# Patient Record
Sex: Female | Born: 1990 | Hispanic: Yes | Marital: Single
Health system: Southern US, Academic
[De-identification: ages and names within clinical notes are randomized; demographics above are authoritative.]

## PROBLEM LIST (undated history)

## (undated) ENCOUNTER — Inpatient Hospital Stay: Payer: Self-pay

## (undated) ENCOUNTER — Telehealth

## (undated) ENCOUNTER — Encounter

## (undated) ENCOUNTER — Ambulatory Visit: Payer: MEDICAID | Attending: Family | Primary: Family

## (undated) ENCOUNTER — Ambulatory Visit

## (undated) ENCOUNTER — Encounter: Attending: Family | Primary: Family

## (undated) ENCOUNTER — Ambulatory Visit: Payer: MEDICAID

## (undated) ENCOUNTER — Ambulatory Visit: Payer: Medicaid (Managed Care)

## (undated) DIAGNOSIS — R10814 Left lower quadrant abdominal tenderness: Secondary | ICD-10-CM

## (undated) DIAGNOSIS — K8020 Calculus of gallbladder without cholecystitis without obstruction: Secondary | ICD-10-CM

## (undated) DIAGNOSIS — R112 Nausea with vomiting, unspecified: Secondary | ICD-10-CM

## (undated) DIAGNOSIS — Z87442 Personal history of urinary calculi: Secondary | ICD-10-CM

## (undated) HISTORY — DX: Nausea with vomiting, unspecified: R11.2

## (undated) HISTORY — DX: Left lower quadrant abdominal tenderness: R10.814

---
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
## 2013-08-18 ENCOUNTER — Emergency Department: Payer: Self-pay | Admitting: Emergency Medicine

## 2013-08-18 LAB — COMPREHENSIVE METABOLIC PANEL
Albumin: 4.4 g/dL (ref 3.4–5.0)
Bilirubin,Total: 1 mg/dL (ref 0.2–1.0)
Calcium, Total: 9.5 mg/dL (ref 8.5–10.1)
Chloride: 104 mmol/L (ref 98–107)
Co2: 24 mmol/L (ref 21–32)
EGFR (African American): >60
Osmolality: 269 (ref 275–301)
Potassium: 4 mmol/L (ref 3.5–5.1)
Total Protein: 7.8 g/dL (ref 6.4–8.2)

## 2013-08-18 LAB — CBC
HGB: 14.2 g/dL (ref 12.0–16.0)
MCH: 28.9 pg (ref 26.0–34.0)
Platelet: 213 10*3/uL (ref 150–440)
RDW: 12.5 % (ref 11.5–14.5)

## 2013-08-18 LAB — URINALYSIS, COMPLETE
Glucose,UR: NEGATIVE mg/dL (ref 0–75)
Leukocyte Esterase: NEGATIVE
Nitrite: NEGATIVE
Ph: 5 (ref 4.5–8.0)
Protein: 30
Specific Gravity: 1.035 (ref 1.003–1.030)
Squamous Epithelial: 4

## 2015-10-24 ENCOUNTER — Encounter: Payer: Self-pay | Admitting: *Deleted

## 2015-10-24 ENCOUNTER — Emergency Department
Admission: EM | Admit: 2015-10-24 | Discharge: 2015-10-24 | Disposition: A | Discharge status: Home / Self Care | Payer: Medicaid Other | Attending: Emergency Medicine | Admitting: Emergency Medicine

## 2015-10-24 DIAGNOSIS — R110 Nausea: Secondary | ICD-10-CM | POA: Insufficient documentation

## 2015-10-24 DIAGNOSIS — Z3491 Encounter for supervision of normal pregnancy, unspecified, first trimester: Secondary | ICD-10-CM

## 2015-10-24 DIAGNOSIS — Z3A01 Less than 8 weeks gestation of pregnancy: Secondary | ICD-10-CM | POA: Diagnosis not present

## 2015-10-24 DIAGNOSIS — R1030 Lower abdominal pain, unspecified: Secondary | ICD-10-CM | POA: Insufficient documentation

## 2015-10-24 DIAGNOSIS — O26891 Other specified pregnancy related conditions, first trimester: Secondary | ICD-10-CM

## 2015-10-24 DIAGNOSIS — O9989 Other specified diseases and conditions complicating pregnancy, childbirth and the puerperium: Secondary | ICD-10-CM | POA: Diagnosis not present

## 2015-10-24 DIAGNOSIS — R102 Pelvic and perineal pain: Secondary | ICD-10-CM | POA: Insufficient documentation

## 2015-10-24 LAB — URINALYSIS COMPLETE WITH MICROSCOPIC (ARMC ONLY)
BACTERIA UA: NONE SEEN
Bilirubin Urine: NEGATIVE
GLUCOSE, UA: NEGATIVE mg/dL
Hgb urine dipstick: NEGATIVE
Ketones, ur: NEGATIVE mg/dL
Leukocytes, UA: NEGATIVE
NITRITE: NEGATIVE
Protein, ur: NEGATIVE mg/dL
SPECIFIC GRAVITY, URINE: 1.016 (ref 1.005–1.030)
pH: 7 (ref 5.0–8.0)

## 2015-10-24 NOTE — ED Notes (Signed)
preg pt has abd cramping no vag bleeding

## 2015-10-24 NOTE — Discharge Instructions (Signed)

## 2015-10-24 NOTE — ED Provider Notes (Signed)
[facility] Provider Note  ____________________________________________  Time seen: 8:00 AM  I have reviewed the triage vital signs and the nursing notes.   HISTORY  Chief Complaint Abdominal Cramping    HPI Carla Vincent is a 24 y.o. female who complains of a pulling or stretching pain in her lower abdomen that started yesterday. Last menstrual period was 09/17/2015. On 1225 when she was late for her. She took a pregnancy test and found that it was positive. She repeated this and it was again positive. She's never been pregnant before. Denies any other symptoms. No radiation, no aggravating or alleviating factors, has some nausea but no vomiting or diarrhea and is eating and drinking normally. No dizziness fevers or chills. Denies any dysuria frequency urgency vaginal bleeding or discharge. This is a desired pregnancy and she has one sexual partner. She is taking prenatal vitamins and has scheduled an initial patient appointment with Donneta Romberg    Past Medical History  Diagnosis Date  . Renal disorder    kidney stones There are no active problems to display for this patient.    History reviewed. No pertinent past surgical history.   No current outpatient prescriptions on file.   Allergies Review of patient's allergies indicates no known allergies.   No family history on file.  Social History Social History  Substance Use Topics  . Smoking status: Never Smoker   . Smokeless tobacco: None  . Alcohol Use: Yes    Review of Systems  Constitutional:   No fever or chills. No weight changes Eyes:   No blurry vision or double vision.  ENT:   No sore throat. Cardiovascular:   No chest pain. Respiratory:   No dyspnea or cough. Gastrointestinal:   Positive as above for abdominal pain, without vomiting and diarrhea.  No BRBPR or melena. Genitourinary:   Negative for dysuria, urinary retention, bloody urine, or difficulty  urinating. Musculoskeletal:   Negative for back pain. No joint swelling or pain. Skin:   Negative for rash. Neurological:   Negative for headaches, focal weakness or numbness. Psychiatric:  No anxiety or depression.   Endocrine:  No hot/cold intolerance, changes in energy, or sleep difficulty.  10-point ROS otherwise negative.  ____________________________________________   PHYSICAL EXAM:  VITAL SIGNS: ED Triage Vitals  Enc Vitals Group     BP 10/24/15 0743 124/73 mmHg     Pulse Rate 10/24/15 0743 78     Resp 10/24/15 0743 20     Temp 10/24/15 0743 98.4 F (36.9 C)     Temp Source 10/24/15 0743 Oral     SpO2 10/24/15 0743 98 %     Weight 10/24/15 0743 125 lb (56.7 kg)     Height 10/24/15 0743  (1.575 m)     Head Cir --      Peak Flow --      Pain Score 10/24/15 0744 7     Pain Loc --      Pain Edu? --      Excl. in GC? --     Vital signs reviewed, nursing assessments reviewed.   Constitutional:   Alert and oriented. Well appearing and in no distress. Eyes:   No scleral icterus. No conjunctival pallor. PERRL. EOMI ENT   Head:   Normocephalic and atraumatic.   Nose:   No congestion/rhinnorhea. No septal hematoma   Mouth/Throat:   MMM, no pharyngeal erythema. No peritonsillar mass. No uvula shift.   Neck:  No stridor. No SubQ emphysema. No meningismus. Hematological/Lymphatic/Immunilogical:   No cervical lymphadenopathy. Cardiovascular:   RRR. Normal and symmetric distal pulses are present in all extremities. No murmurs, rubs, or gallops. Respiratory:   Normal respiratory effort without tachypnea nor retractions. Breath sounds are clear and equal bilaterally. No wheezes/rales/rhonchi. Gastrointestinal:   Soft with mild suprapubic tenderness. No distention. There is no CVA tenderness.  No rebound, rigidity, or guarding. Genitourinary:   deferred Musculoskeletal:   Nontender with normal range of motion in all extremities. No joint effusions.  No lower  extremity tenderness.  No edema. Neurologic:   Normal speech and language.  CN 2-10 normal. Motor grossly intact. No pronator drift.  Normal gait. No gross focal neurologic deficits are appreciated.  Skin:    Skin is warm, dry and intact. No rash noted.  No petechiae, purpura, or bullae. Psychiatric:   Mood and affect are normal. Speech and behavior are normal. Patient exhibits appropriate insight and judgment.  ____________________________________________    LABS (pertinent positives/negatives) (all labs ordered are listed, but only abnormal results are displayed) Labs Reviewed  URINALYSIS COMPLETEWITH MICROSCOPIC ([facility] ONLY) - Abnormal; Notable for the following:    Color, Urine YELLOW (*)    APPearance CLEAR (*)    Squamous Epithelial / LPF 0-5 (*)    All other components within normal limits   ____________________________________________   EKG    ____________________________________________    RADIOLOGY    ____________________________________________   PROCEDURES   ____________________________________________   INITIAL IMPRESSION / ASSESSMENT AND PLAN / ED COURSE  Pertinent labs & imaging results that were available during my care of the patient were reviewed by me and considered in my medical decision making (see chart for details).  Patient with known desired pregnancy complains of some pelvic or suprapubic pain. We'll check a urinalysis to ensure she does not have bacteriuria. No other significant symptoms or concerns. Low suspicion for PID STI TOA torsion and appendicitis. No suspicion for trauma or abuse. Very well appearing pleasant to interact with calm and comfortable. Appears to be stable to continue multivitamins and follow-up with [facility], plus or minus antibiotics depending on the urinalysis.  ----------------------------------------- 8:48 AM on 10/24/2015 -----------------------------------------  Urinalysis unremarkable. We'll discharge  patient home   ____________________________________________   FINAL CLINICAL IMPRESSION(S) / ED DIAGNOSES  Final diagnoses:  First trimester pregnancy  Pelvic pain in antepartum period in first trimester      Sharman CheekPhillip Tinlee Navarrette, MD 10/24/15 [phone]

## 2015-10-24 NOTE — ED Notes (Addendum)
G1/P0/A0 with LMP 09/17/15  Patient describes bilateral lower quadrant pain as a 7/10 "pulling or stretching pain in an up-down motion" denies radiation. Denies vaginal discharge or bleeding. Pain changes with motion

## 2015-10-26 NOTE — L&D Delivery Note (Signed)
Delivery Note At 8:44 PM a viable female sex was delivered via Vaginal, Spontaneous Delivery (Presentation: ROA).  APGAR: 8, 9; weight pending.  Placenta status: spontaneous, intact.  Cord: 3VC, nuchal x 1 with the following complications: meconium, nuchal cord x 1.  Cord pH: N/A  Anesthesia:  none Episiotomy: None Lacerations: None Suture Repair: none Est. Blood Loss (mL):    Mom to postpartum.  Baby to Couplet care / Skin to Skin.  Carla Vincent M 06/30/2016, 8:51 PM

## 2015-10-30 LAB — OB RESULTS CONSOLE RUBELLA ANTIBODY, IGM: RUBELLA: NON-IMMUNE/NOT IMMUNE

## 2015-10-30 LAB — OB RESULTS CONSOLE HEPATITIS B SURFACE ANTIGEN: Hepatitis B Surface Ag: NEGATIVE

## 2015-10-30 LAB — OB RESULTS CONSOLE VARICELLA ZOSTER ANTIBODY, IGG: Varicella: NON-IMMUNE/NOT IMMUNE

## 2016-03-15 ENCOUNTER — Encounter: Payer: Self-pay | Admitting: *Deleted

## 2016-03-15 ENCOUNTER — Observation Stay
Admission: EM | Admit: 2016-03-15 | Discharge: 2016-03-15 | Disposition: A | Discharge status: Home / Self Care | Payer: Medicaid Other | Attending: Obstetrics & Gynecology | Admitting: Obstetrics & Gynecology

## 2016-03-15 DIAGNOSIS — R10814 Left lower quadrant abdominal tenderness: Secondary | ICD-10-CM

## 2016-03-15 DIAGNOSIS — Z3A25 25 weeks gestation of pregnancy: Secondary | ICD-10-CM | POA: Diagnosis not present

## 2016-03-15 DIAGNOSIS — O9989 Other specified diseases and conditions complicating pregnancy, childbirth and the puerperium: Principal | ICD-10-CM | POA: Insufficient documentation

## 2016-03-15 DIAGNOSIS — R1032 Left lower quadrant pain: Secondary | ICD-10-CM | POA: Diagnosis not present

## 2016-03-15 HISTORY — DX: Left lower quadrant abdominal tenderness: R10.814

## 2016-03-15 LAB — URINALYSIS COMPLETE WITH MICROSCOPIC (ARMC ONLY)
BILIRUBIN URINE: NEGATIVE
GLUCOSE, UA: NEGATIVE mg/dL
HGB URINE DIPSTICK: NEGATIVE
Ketones, ur: NEGATIVE mg/dL
LEUKOCYTES UA: NEGATIVE
NITRITE: NEGATIVE
Protein, ur: NEGATIVE mg/dL
SPECIFIC GRAVITY, URINE: 1.005 (ref 1.005–1.030)
pH: 6 (ref 5.0–8.0)

## 2016-03-15 MED ORDER — ACETAMINOPHEN 325 MG PO TABS
650.0000 mg | ORAL_TABLET | ORAL | Status: DC | PRN
  Administered 2016-03-15: 650 mg via ORAL
  Filled 2016-03-15: qty 2

## 2016-03-15 MED ORDER — ONDANSETRON HCL 4 MG/2ML IJ SOLN: 4.0000 mg | Freq: Four times a day (QID) | INTRAMUSCULAR | Status: DC | PRN

## 2016-03-15 NOTE — Final Progress Note (Signed)
Physician Final Progress Note  Patient ID: Carla FlaxDiorella Vincent MRN: [id number] DOB/AGE: February 05, 1991 24 y.o.  Admit date: 03/15/2016 Admitting provider: Nadara Mustardobert P Harris, MD Discharge date: 03/15/2016  Admission Diagnoses: Pt is a G1Po at 25 weeks WF with 2 day h/o left side and back pain.  Nausea yesterday and today, but has been able to eat.  Pain right after void today as well. Seen [facility] ER Friday (2 days ago) with no findings and normal UA (reviewed). Denies dysuria, vag d/c, VB, LOF, ctxs, diarhhea, constipation.  Discharge Diagnoses:  Principal Problem:   Left lower quadrant abdominal tenderness   Left flank Tenderness  Consults: None  Significant Findings/ Diagnostic Studies: labs: Normal UA (no blood or WBC)  Procedures: none.  FHTs 140s.    Discharge Condition: good  Disposition: 01-Home or Self Care  Diet: Regular diet  Discharge Activity: Activity as tolerated     Medication List    ASK your doctor about these medications        acetaminophen 325 MG tablet  Commonly known as:  TYLENOL  Take 200 mg by mouth every 6 (six) hours as needed.     prenatal multivitamin Tabs tablet  Take 1 tablet by mouth daily at 12 noon.       Follow-up Information    Go to Letitia Libraobert Paul Harris, MD.   Specialty:  Obstetrics and Gynecology   Why:  routine prenatal visit   Contact information:   [address] [place] [address] [phone]       Follow up with Letitia Libraobert Paul Harris, MD.   Specialty:  Obstetrics and Gynecology   Why:  make an appointment to come in this week   Contact information:   [address] [place] [address] [phone]       Total time spent taking care of this patient: 15 minutes  Signed: Letitia Libraobert Paul Harris 03/15/2016, 5:49 AM

## 2016-03-15 NOTE — Discharge Instructions (Signed)
Follow up this week with Dr. Tiburcio PeaHarris. May take up to 650mg  Tylenol  Come back if you have decreased fetal movement, temp over 100.4, heavy vaginal bleeding or a big gush of fluid

## 2016-03-15 NOTE — Progress Notes (Signed)
[facility] with c/o left lower quadrant pain that radiates to the back. Pt states she has only taken tylenol extra strength Saturday and Sunday for her pain. Urine sent to lab. Awaiting MD orders

## 2016-03-16 LAB — URINE CULTURE

## 2016-04-02 LAB — OB RESULTS CONSOLE RPR: RPR: NONREACTIVE

## 2016-04-02 LAB — OB RESULTS CONSOLE HIV ANTIBODY (ROUTINE TESTING): HIV: NONREACTIVE

## 2016-05-09 ENCOUNTER — Encounter: Payer: Self-pay | Admitting: *Deleted

## 2016-05-09 ENCOUNTER — Observation Stay
Admission: EM | Admit: 2016-05-09 | Discharge: 2016-05-09 | Disposition: A | Discharge status: Home / Self Care | Payer: Medicaid Other | Attending: Obstetrics and Gynecology | Admitting: Obstetrics and Gynecology

## 2016-05-09 DIAGNOSIS — R112 Nausea with vomiting, unspecified: Secondary | ICD-10-CM

## 2016-05-09 DIAGNOSIS — R197 Diarrhea, unspecified: Secondary | ICD-10-CM | POA: Diagnosis not present

## 2016-05-09 DIAGNOSIS — Z3A33 33 weeks gestation of pregnancy: Secondary | ICD-10-CM | POA: Diagnosis not present

## 2016-05-09 DIAGNOSIS — R109 Unspecified abdominal pain: Secondary | ICD-10-CM | POA: Diagnosis not present

## 2016-05-09 DIAGNOSIS — O9989 Other specified diseases and conditions complicating pregnancy, childbirth and the puerperium: Secondary | ICD-10-CM | POA: Diagnosis present

## 2016-05-09 HISTORY — DX: Nausea with vomiting, unspecified: R11.2

## 2016-05-09 LAB — URINALYSIS COMPLETE WITH MICROSCOPIC (ARMC ONLY)
Bilirubin Urine: NEGATIVE
Glucose, UA: NEGATIVE mg/dL
HGB URINE DIPSTICK: NEGATIVE
LEUKOCYTES UA: NEGATIVE
Nitrite: NEGATIVE
PH: 7 (ref 5.0–8.0)
PROTEIN: NEGATIVE mg/dL
Specific Gravity, Urine: 1.01 (ref 1.005–1.030)

## 2016-05-09 NOTE — Discharge Summary (Signed)
Obstetric History and Physical  Carla Vincent is a 25 y.o. G1P0 with Estimated Date of Delivery: 06/23/16 per LMP & 8 wk [place] who presents at [redacted]w[redacted]d  presenting for nausea & vomiting x 3 days and diarrhea this am. Now tolerating po intake. Also reporting abdominal cramping q 20 minutes. Patient states she has been having no vaginal bleeding, intact membranes, with active fetal movement.    Prenatal Course Source of Care: [facility]  with onset of care at 6 weeks Pregnancy complications or risks: Patient Active Problem List   Diagnosis Date Noted  . Nausea & vomiting 05/09/2016  . Left lower quadrant abdominal tenderness 03/15/2016      Prenatal Transfer Tool   Past Medical History  Diagnosis Date  . Renal disorder     History reviewed. No pertinent past surgical history.  OB History  Gravida Para Term Preterm AB SAB TAB Ectopic Multiple Living  1             # Outcome Date GA Lbr Len/2nd Weight Sex Delivery Anes PTL Lv  1 Current               Social History   Social History  . Marital Status: Unknown    Spouse Name: N/A  . Number of Children: N/A  . Years of Education: N/A   Social History Main Topics  . Smoking status: Never Smoker   . Smokeless tobacco: None  . Alcohol Use: No  . Drug Use: Yes - marijuana   . Sexual Activity: Not Asked   Other Topics Concern  . None   Social History Narrative    History reviewed. No pertinent family history.  Prescriptions prior to admission  Medication Sig Dispense Refill Last Dose  . acetaminophen (TYLENOL) 325 MG tablet Take 200 mg by mouth every 6 (six) hours as needed.   03/14/2016 at 2100  . Prenatal Vit-Fe Fumarate-FA (PRENATAL MULTIVITAMIN) TABS tablet Take 1 tablet by mouth daily at 12 noon.   03/14/2016 at Unknown time    No Known Allergies  Review of Systems: Negative except for what is mentioned in HPI.  Physical Exam: BP 120/64 mmHg  Pulse 92  Temp(Src) 98.6 F (37 C) (Oral)  Resp 16  Ht  (1.575 m)   Wt 149 lb (67.586 kg)  BMI 27.25 kg/m2  LMP 09/17/2015 GENERAL: Well-developed, well-nourished female in no acute distress.  ABDOMEN: Soft, nontender, nondistended, gravid. EXTREMITIES: Nontender, no edema Cervical Exam: FT/long/ballotable Presentation: cephalic FHT: Category: 1 Baseline rate 130 bpm   Variability moderate  Accelerations present   Decelerations none Contractions: irregular   Pertinent Labs/Studies:   Results for orders placed or performed during the hospital encounter of 05/09/16 (from the past 24 hour(s))  Urinalysis complete, with microscopic ([facility] only)     Status: Abnormal   Collection Time: 05/09/16  4:02 PM  Result Value Ref Range   Color, Urine YELLOW (A) YELLOW   APPearance CLEAR (A) CLEAR   Glucose, UA NEGATIVE NEGATIVE mg/dL   Bilirubin Urine NEGATIVE NEGATIVE   Ketones, ur TRACE (A) NEGATIVE mg/dL   Specific Gravity, Urine 1.010 1.005 - 1.030   Hgb urine dipstick NEGATIVE NEGATIVE   pH 7.0 5.0 - 8.0   Protein, ur NEGATIVE NEGATIVE mg/dL   Nitrite NEGATIVE NEGATIVE   Leukocytes, UA NEGATIVE NEGATIVE   RBC / HPF 0-5 0 - 5 RBC/hpf   WBC, UA 0-5 0 - 5 WBC/hpf   Bacteria, UA RARE (A) NONE SEEN   Squamous  Epithelial / LPF 0-5 (A) NONE SEEN   Mucous PRESENT     Assessment : IUP at 2087w4d, gastroenteritis vs food poisoning - resolving  Plan: Discharge Home  BRAT diet and po hydration, pelvic rest x 1 week  F/u at office as scheduled on 05/14/16

## 2016-05-09 NOTE — OB Triage Note (Signed)
[id number]  C/o abdominal pain since Thursday with increasing pain today.  Admits N/V.  Changed to gown and to bed EFM applied.  Plan of care discussed and oriented to room.  Verbalized understanding and agrees with plan.

## 2016-05-28 ENCOUNTER — Inpatient Hospital Stay
Admission: EM | Admit: 2016-05-28 | Discharge: 2016-05-29 | Disposition: A | Discharge status: Home / Self Care | Payer: Medicaid Other | Attending: Emergency Medicine | Admitting: Emergency Medicine

## 2016-05-28 ENCOUNTER — Encounter: Payer: Self-pay | Admitting: *Deleted

## 2016-05-28 DIAGNOSIS — O9A213 Injury, poisoning and certain other consequences of external causes complicating pregnancy, third trimester: Secondary | ICD-10-CM | POA: Diagnosis not present

## 2016-05-28 DIAGNOSIS — Y999 Unspecified external cause status: Secondary | ICD-10-CM | POA: Insufficient documentation

## 2016-05-28 DIAGNOSIS — T148XXA Other injury of unspecified body region, initial encounter: Secondary | ICD-10-CM

## 2016-05-28 DIAGNOSIS — R1010 Upper abdominal pain, unspecified: Secondary | ICD-10-CM | POA: Insufficient documentation

## 2016-05-28 DIAGNOSIS — Y92410 Unspecified street and highway as the place of occurrence of the external cause: Secondary | ICD-10-CM | POA: Diagnosis not present

## 2016-05-28 DIAGNOSIS — S80812A Abrasion, left lower leg, initial encounter: Secondary | ICD-10-CM | POA: Insufficient documentation

## 2016-05-28 DIAGNOSIS — Z3A36 36 weeks gestation of pregnancy: Secondary | ICD-10-CM | POA: Insufficient documentation

## 2016-05-28 DIAGNOSIS — T22031A Burn of unspecified degree of right upper arm, initial encounter: Secondary | ICD-10-CM | POA: Insufficient documentation

## 2016-05-28 DIAGNOSIS — Y939 Activity, unspecified: Secondary | ICD-10-CM | POA: Insufficient documentation

## 2016-05-28 LAB — CBC
HEMATOCRIT: 29.8 % — AB (ref 35.0–47.0)
HEMOGLOBIN: 10.1 g/dL — AB (ref 12.0–16.0)
MCH: 27.5 pg (ref 26.0–34.0)
MCHC: 33.9 g/dL (ref 32.0–36.0)
MCV: 81.3 fL (ref 80.0–100.0)
Platelets: 180 10*3/uL (ref 150–440)
RBC: 3.67 MIL/uL — AB (ref 3.80–5.20)
RDW: 14.2 % (ref 11.5–14.5)
WBC: 14.5 10*3/uL — AB (ref 3.6–11.0)

## 2016-05-28 LAB — FIBRINOGEN: FIBRINOGEN: 477 mg/dL — AB (ref 210–475)

## 2016-05-28 MED ORDER — ACETAMINOPHEN 500 MG PO TABS
ORAL_TABLET | ORAL | Status: AC
  Administered 2016-05-28: 1000 mg via ORAL
  Filled 2016-05-28: qty 2

## 2016-05-28 MED ORDER — ACETAMINOPHEN 325 MG PO TABS: 650.0000 mg | ORAL_TABLET | Freq: Once | ORAL | Status: DC

## 2016-05-28 MED ORDER — LACTATED RINGERS IV BOLUS (SEPSIS)
500.0000 mL | Freq: Once | INTRAVENOUS | Status: AC
  Administered 2016-05-28: 500 mL via INTRAVENOUS

## 2016-05-28 MED ORDER — LACTATED RINGERS IV SOLN
INTRAVENOUS | Status: DC
Start: 2016-05-28 — End: 2016-05-29
  Administered 2016-05-28: 22:00:00 via INTRAVENOUS
  Administered 2016-05-29: 125 mL/h via INTRAVENOUS
  Administered 2016-05-29: 01:00:00 via INTRAVENOUS

## 2016-05-28 MED ORDER — ZOLPIDEM TARTRATE 5 MG PO TABS
5.0000 mg | ORAL_TABLET | Freq: Every evening | ORAL | Status: DC | PRN
  Administered 2016-05-29: 5 mg via ORAL
  Filled 2016-05-28: qty 1

## 2016-05-28 MED ORDER — ACETAMINOPHEN 500 MG PO TABS
1000.0000 mg | ORAL_TABLET | Freq: Once | ORAL | Status: AC
  Administered 2016-05-28: 1000 mg via ORAL

## 2016-05-28 MED ORDER — MORPHINE SULFATE (PF) 2 MG/ML IV SOLN
2.0000 mg | INTRAVENOUS | Status: AC | PRN
  Administered 2016-05-28 (×2): 2 mg via INTRAVENOUS
  Filled 2016-05-28 (×2): qty 1

## 2016-05-28 MED ORDER — ACETAMINOPHEN 325 MG PO TABS
650.0000 mg | ORAL_TABLET | ORAL | Status: DC | PRN
  Administered 2016-05-29: 650 mg via ORAL
  Filled 2016-05-28: qty 2

## 2016-05-28 MED ORDER — CALCIUM CARBONATE ANTACID 500 MG PO CHEW: 2.0000 | CHEWABLE_TABLET | ORAL | Status: DC | PRN

## 2016-05-28 NOTE — ED Notes (Signed)
Report given to Dois Davenport at L&D

## 2016-05-28 NOTE — ED Notes (Signed)
Wound on the upper left thigh was cleaned with sterile water, bactrim was applied over the wound and the wound was wrapped with sterile gauze.
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
## 2016-05-29 MED ORDER — MORPHINE SULFATE (PF) 2 MG/ML IV SOLN: 2.0000 mg | INTRAVENOUS | Status: DC | PRN

## 2016-05-29 MED ORDER — TERBUTALINE SULFATE 1 MG/ML IJ SOLN
0.2500 mg | Freq: Once | INTRAMUSCULAR | Status: AC
  Administered 2016-05-29: 0.25 mg via SUBCUTANEOUS
  Filled 2016-05-29: qty 1
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
## 2016-05-29 NOTE — Discharge Instructions (Signed)

## 2016-05-30 ENCOUNTER — Emergency Department: Payer: Medicaid Other

## 2016-05-30 ENCOUNTER — Encounter: Payer: Self-pay | Admitting: Medical Oncology

## 2016-05-30 ENCOUNTER — Emergency Department
Admission: EM | Admit: 2016-05-30 | Discharge: 2016-05-30 | Disposition: A | Discharge status: Home / Self Care | Payer: Medicaid Other | Attending: Emergency Medicine | Admitting: Emergency Medicine

## 2016-05-30 DIAGNOSIS — Y939 Activity, unspecified: Secondary | ICD-10-CM | POA: Insufficient documentation

## 2016-05-30 DIAGNOSIS — Y92410 Unspecified street and highway as the place of occurrence of the external cause: Secondary | ICD-10-CM | POA: Insufficient documentation

## 2016-05-30 DIAGNOSIS — Y999 Unspecified external cause status: Secondary | ICD-10-CM | POA: Diagnosis not present

## 2016-05-30 DIAGNOSIS — Z3A36 36 weeks gestation of pregnancy: Secondary | ICD-10-CM | POA: Insufficient documentation

## 2016-05-30 DIAGNOSIS — M940 Chondrocostal junction syndrome [Tietze]: Secondary | ICD-10-CM | POA: Insufficient documentation

## 2016-05-30 DIAGNOSIS — O9989 Other specified diseases and conditions complicating pregnancy, childbirth and the puerperium: Secondary | ICD-10-CM | POA: Diagnosis present

## 2016-05-30 DIAGNOSIS — R1012 Left upper quadrant pain: Secondary | ICD-10-CM | POA: Insufficient documentation

## 2016-05-30 LAB — CBC WITH DIFFERENTIAL/PLATELET
Basophils Absolute: 0.1 10*3/uL (ref 0–0.1)
Basophils Relative: 1 %
EOS ABS: 0.2 10*3/uL (ref 0–0.7)
EOS PCT: 2 %
HCT: 30.9 % — ABNORMAL LOW (ref 35.0–47.0)
Hemoglobin: 10.4 g/dL — ABNORMAL LOW (ref 12.0–16.0)
LYMPHS ABS: 1.9 10*3/uL (ref 1.0–3.6)
LYMPHS PCT: 16 %
MCH: 27.2 pg (ref 26.0–34.0)
MCHC: 33.7 g/dL (ref 32.0–36.0)
MCV: 80.8 fL (ref 80.0–100.0)
MONOS PCT: 7 %
Monocytes Absolute: 0.8 10*3/uL (ref 0.2–0.9)
Neutro Abs: 8.9 10*3/uL — ABNORMAL HIGH (ref 1.4–6.5)
Neutrophils Relative %: 74 %
PLATELETS: 188 10*3/uL (ref 150–440)
RBC: 3.83 MIL/uL (ref 3.80–5.20)
RDW: 14 % (ref 11.5–14.5)
WBC: 12 10*3/uL — AB (ref 3.6–11.0)

## 2016-05-30 LAB — COMPREHENSIVE METABOLIC PANEL
ALK PHOS: 107 U/L (ref 38–126)
ALT: 11 U/L — AB (ref 14–54)
ANION GAP: 7 (ref 5–15)
AST: 18 U/L (ref 15–41)
Albumin: 2.7 g/dL — ABNORMAL LOW (ref 3.5–5.0)
BUN: 6 mg/dL (ref 6–20)
CALCIUM: 8.8 mg/dL — AB (ref 8.9–10.3)
CO2: 20 mmol/L — ABNORMAL LOW (ref 22–32)
CREATININE: 0.4 mg/dL — AB (ref 0.44–1.00)
Chloride: 107 mmol/L (ref 101–111)
Glucose, Bld: 94 mg/dL (ref 65–99)
Potassium: 4.1 mmol/L (ref 3.5–5.1)
SODIUM: 134 mmol/L — AB (ref 135–145)
Total Bilirubin: 0.7 mg/dL (ref 0.3–1.2)
Total Protein: 6.4 g/dL — ABNORMAL LOW (ref 6.5–8.1)

## 2016-05-30 LAB — TROPONIN I

## 2016-05-30 LAB — LIPASE, BLOOD: LIPASE: 12 U/L (ref 11–51)

## 2016-05-30 MED ORDER — ONDANSETRON HCL 4 MG/2ML IJ SOLN
4.0000 mg | Freq: Once | INTRAMUSCULAR | Status: AC
  Administered 2016-05-30: 4 mg via INTRAVENOUS
  Filled 2016-05-30: qty 2

## 2016-05-30 MED ORDER — SODIUM CHLORIDE 0.9 % IV SOLN
1000.0000 mL | Freq: Once | INTRAVENOUS | Status: AC
  Administered 2016-05-30: 1000 mL via INTRAVENOUS

## 2016-05-30 MED ORDER — MORPHINE SULFATE (PF) 4 MG/ML IV SOLN
4.0000 mg | Freq: Once | INTRAVENOUS | Status: AC
  Administered 2016-05-30: 4 mg via INTRAVENOUS
  Filled 2016-05-30: qty 1

## 2016-05-30 MED ORDER — OXYCODONE-ACETAMINOPHEN 5-325 MG PO TABS: 1.0000 | ORAL_TABLET | Freq: Four times a day (QID) | ORAL | 0 refills | Status: DC | PRN
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
## 2016-05-30 NOTE — ED Notes (Signed)
Pt transported to ultrasound.
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
## 2016-06-03 LAB — OB RESULTS CONSOLE GBS: GBS: NEGATIVE

## 2016-06-03 LAB — OB RESULTS CONSOLE GC/CHLAMYDIA
CHLAMYDIA, DNA PROBE: NEGATIVE
GC PROBE AMP, GENITAL: NEGATIVE

## 2016-06-28 ENCOUNTER — Encounter: Payer: Self-pay | Admitting: *Deleted

## 2016-06-28 ENCOUNTER — Observation Stay
Admission: EM | Admit: 2016-06-28 | Discharge: 2016-06-28 | Disposition: A | Discharge status: Home / Self Care | Payer: BLUE CROSS/BLUE SHIELD | Source: Home / Self Care | Admitting: Obstetrics and Gynecology

## 2016-06-28 DIAGNOSIS — Z3A39 39 weeks gestation of pregnancy: Secondary | ICD-10-CM

## 2016-06-28 DIAGNOSIS — O471 False labor at or after 37 completed weeks of gestation: Secondary | ICD-10-CM | POA: Insufficient documentation

## 2016-06-28 NOTE — Discharge Instructions (Signed)
Discharge instructions reviewed with patient, copy signed and given. Pt. Scheduled for induction tomorrow, June 29, 2016 at 2000 (8p.m.).  Labor precautions given, pt. Verbalized understanding.

## 2016-06-28 NOTE — OB Triage Note (Signed)
Pt. Here b/c of leaking fluid, started yesterday around 1800, clear fluid.  at   Pt. States she lost her mucus plug on Friday, September 1st. Clear fluid noted, Nitrazine test neg.  Fetal heart rate 130s with irreg. contractions, contractions lasting 40 secs  Per patient..positive for fetal movement.  No vaginal bleeding noted, last sexual encounter was Saturday, September 2nd.

## 2016-06-28 NOTE — Final Progress Note (Signed)
Physician Final Progress Note  Patient ID: Carla FlaxDiorella Mullarkey MRN: [id number] DOB/AGE: 1991-06-15 24 y.o.  Admit date: 06/28/2016 Admitting provider: Vena AustriaAndreas Matix Henshaw, MD Discharge date: 06/28/2016   Admission Diagnoses: Irregular contractions  Discharge Diagnoses:  Active Problems:   Labor and delivery, indication for care   Consults: None  Significant Findings/ Diagnostic Studies: none  Procedures: NST FHT 120, moderate variability, +accels, no decels irregular contractions q5-3910min over 2hrs of monitoring with no cervical change  Discharge Condition: good  Disposition: 01-Home or Self Care  Diet: Regular diet  Discharge Activity: Activity as tolerated  Discharge Instructions    Discharge activity:  No Restrictions    Complete by:  As directed   Discharge diet:  No restrictions    Complete by:  As directed   Fetal Kick Count:  Lie on our left side for one hour after a meal, and count the number of times your baby kicks.  If it is less than 5 times, get up, move around and drink some juice.  Repeat the test 30 minutes later.  If it is still less than 5 kicks in an hour, notify your doctor.    Complete by:  As directed   LABOR:  When conractions begin, you should start to time them from the beginning of one contraction to the beginning  of the next.  When contractions are 5 - 10 minutes apart or less and have been regular for at least an hour, you should call your health care provider.    Complete by:  As directed   No sexual activity restrictions    Complete by:  As directed   Notify physician for bleeding from the vagina    Complete by:  As directed   Notify physician for blurring of vision or spots before the eyes    Complete by:  As directed   Notify physician for chills or fever    Complete by:  As directed   Notify physician for fainting spells, "black outs" or loss of consciousness    Complete by:  As directed   Notify physician for increase in vaginal discharge    Complete  by:  As directed   Notify physician for leaking of fluid    Complete by:  As directed   Notify physician for pain or burning when urinating    Complete by:  As directed   Notify physician for pelvic pressure (sudden increase)    Complete by:  As directed   Notify physician for severe or continued nausea or vomiting    Complete by:  As directed   Notify physician for sudden gushing of fluid from the vagina (with or without continued leaking)    Complete by:  As directed   Notify physician for sudden, constant, or occasional abdominal pain    Complete by:  As directed   Notify physician if baby moving less than usual    Complete by:  As directed       Medication List    STOP taking these medications   oxyCODONE-acetaminophen 5-325 MG tablet Commonly known as:  ROXICET     TAKE these medications   Prenatal Adult Gummy/DHA/FA 0.4-25 MG Chew Chew 2 each by mouth every morning.        Total time spent taking care of this patient: 15 minutes  Signed: Lorrene ReidSTAEBLER, Hagar Sadiq M 06/28/2016, 3:07 PM

## 2016-06-29 ENCOUNTER — Inpatient Hospital Stay
Admit: 2016-06-29 | Discharge: 2016-07-02 | DRG: 775 | Disposition: A | Discharge status: Home / Self Care | Payer: BLUE CROSS/BLUE SHIELD | Attending: Obstetrics and Gynecology | Admitting: Obstetrics and Gynecology

## 2016-06-29 DIAGNOSIS — Z23 Encounter for immunization: Secondary | ICD-10-CM

## 2016-06-29 DIAGNOSIS — Z3A40 40 weeks gestation of pregnancy: Secondary | ICD-10-CM | POA: Diagnosis not present

## 2016-06-29 DIAGNOSIS — D62 Acute posthemorrhagic anemia: Secondary | ICD-10-CM | POA: Diagnosis present

## 2016-06-29 DIAGNOSIS — O480 Post-term pregnancy: Secondary | ICD-10-CM | POA: Diagnosis present

## 2016-06-29 DIAGNOSIS — O9902 Anemia complicating childbirth: Secondary | ICD-10-CM | POA: Diagnosis present

## 2016-06-29 LAB — CBC
HEMATOCRIT: 29.5 % — AB (ref 35.0–47.0)
Hemoglobin: 10.1 g/dL — ABNORMAL LOW (ref 12.0–16.0)
MCH: 26.7 pg (ref 26.0–34.0)
MCHC: 34.2 g/dL (ref 32.0–36.0)
MCV: 78.1 fL — AB (ref 80.0–100.0)
PLATELETS: 159 10*3/uL (ref 150–440)
RBC: 3.77 MIL/uL — ABNORMAL LOW (ref 3.80–5.20)
RDW: 15 % — AB (ref 11.5–14.5)
WBC: 12.1 10*3/uL — ABNORMAL HIGH (ref 3.6–11.0)

## 2016-06-29 LAB — URINE DRUG SCREEN, QUALITATIVE (ARMC ONLY)
Amphetamines, Ur Screen: NOT DETECTED
BARBITURATES, UR SCREEN: NOT DETECTED
Benzodiazepine, Ur Scrn: NOT DETECTED
COCAINE METABOLITE, UR ~~LOC~~: NOT DETECTED
Cannabinoid 50 Ng, Ur ~~LOC~~: NOT DETECTED
MDMA (Ecstasy)Ur Screen: NOT DETECTED
METHADONE SCREEN, URINE: NOT DETECTED
Opiate, Ur Screen: NOT DETECTED
Phencyclidine (PCP) Ur S: NOT DETECTED
TRICYCLIC, UR SCREEN: NOT DETECTED

## 2016-06-29 LAB — TYPE AND SCREEN
ABO/RH(D): A POS
ANTIBODY SCREEN: NEGATIVE

## 2016-06-29 MED ORDER — LIDOCAINE HCL (PF) 1 % IJ SOLN: 30.0000 mL | INTRAMUSCULAR | Status: DC | PRN

## 2016-06-29 MED ORDER — BUTORPHANOL TARTRATE 1 MG/ML IJ SOLN
1.0000 mg | INTRAMUSCULAR | Status: DC | PRN
  Administered 2016-06-30 (×3): 1 mg via INTRAVENOUS
  Filled 2016-06-29 (×3): qty 1

## 2016-06-29 MED ORDER — LACTATED RINGERS IV SOLN
INTRAVENOUS | Status: DC
  Administered 2016-06-29 – 2016-06-30 (×3): via INTRAVENOUS

## 2016-06-29 MED ORDER — OXYTOCIN BOLUS FROM INFUSION: 500.0000 mL | Freq: Once | INTRAVENOUS | Status: DC

## 2016-06-29 MED ORDER — DINOPROSTONE 10 MG VA INST
10.0000 mg | VAGINAL_INSERT | Freq: Once | VAGINAL | Status: AC
  Administered 2016-06-29: 10 mg via VAGINAL
  Filled 2016-06-29 (×2): qty 1

## 2016-06-29 MED ORDER — ZOLPIDEM TARTRATE 5 MG PO TABS
5.0000 mg | ORAL_TABLET | Freq: Once | ORAL | Status: AC
  Administered 2016-06-29: 5 mg via ORAL
  Filled 2016-06-29: qty 1

## 2016-06-29 MED ORDER — TERBUTALINE SULFATE 1 MG/ML IJ SOLN: 0.2500 mg | Freq: Once | INTRAMUSCULAR | Status: DC | PRN

## 2016-06-29 MED ORDER — ACETAMINOPHEN 325 MG PO TABS: 650.0000 mg | ORAL_TABLET | ORAL | Status: DC | PRN

## 2016-06-29 MED ORDER — LACTATED RINGERS IV SOLN: 500.0000 mL | INTRAVENOUS | Status: DC | PRN

## 2016-06-29 MED ORDER — OXYTOCIN 40 UNITS IN LACTATED RINGERS INFUSION - SIMPLE MED
2.5000 [IU]/h | INTRAVENOUS | Status: DC
  Filled 2016-06-29: qty 1000

## 2016-06-29 MED ORDER — ONDANSETRON HCL 4 MG/2ML IJ SOLN
4.0000 mg | Freq: Four times a day (QID) | INTRAMUSCULAR | Status: DC | PRN
  Administered 2016-06-30: 4 mg via INTRAVENOUS
  Filled 2016-06-29: qty 2

## 2016-06-29 NOTE — H&P (Signed)
OB History & Physical   History of Present Illness:  Chief Complaint: Postdates induction of labor  HPI:  Carla Vincent is a 25 y.o. G1P0 female at 7149w6d dated by LMP.  Her pregnancy has been complicated by Positive Cannabinoid screen, rubella non immune, varicella non immune, anemia, elevated 1 hr gtt and normal 3 hr gtt.    She reports contractions.   She denies leakage of fluid.   She denies vaginal bleeding.   She reports fetal movement.    Maternal Medical History:   Past Medical History:  Diagnosis Date  . Renal disorder     History reviewed. No pertinent surgical history.  No Known Allergies  Prior to Admission medications   Medication Sig Start Date End Date Taking? Authorizing Provider  Prenatal MV & Min w/FA-DHA (PRENATAL ADULT GUMMY/DHA/FA) 0.4-25 MG CHEW Chew 2 each by mouth every morning.    Historical Provider, MD    OB History  Gravida Para Term Preterm AB Living  1            SAB TAB Ectopic Multiple Live Births               # Outcome Date GA Lbr Len/2nd Weight Sex Delivery Anes PTL Lv  1 Current               Prenatal care site: [facility]  Social History: She  reports that she has never smoked. She has never used smokeless tobacco. She reports that she uses drugs, including Marijuana. She reports that she does not drink alcohol.  Family History: family history is not on file.   Review of Systems: Negative x 10 systems reviewed except as noted in the HPI.    Physical Exam:  Vital Signs: LMP 09/17/2015  General: no acute distress.  HEENT: normocephalic, atraumatic Heart: regular rate & rhythm.  No murmurs/rubs/gallops Lungs: clear to auscultation bilaterally Abdomen: soft, gravid, non-tender;  EFW: 7.5 pounds Pelvic:   External: Normal external female genitalia  Cervix:  3 / 50  / -2    Extremities: non-tender, symmetric, trace edema bilaterally.  DTRs: 2+ bilaterally  Neurologic: Alert & oriented x 3.    Pertinent Results:  Prenatal  Labs: Blood type/Rh A positive  Antibody screen negative  Rubella Not immune  Varicella Not immune    RPR Non reactive  HBsAg negative  HIV negative  GC negative  Chlamydia negative  Genetic screening Negative  1 hour GTT 166 on 6/9  3 hour GTT 85, 153, 140, 111 all normal  GBS negative on 8/10   Baseline FHR: 145 beats/min   Variability: moderate   Accelerations: present   Decelerations: absent Contractions: present frequency: q 5-10 Overall assessment: Category I tracing    Assessment:  Carla Vincent is a 25 y.o. G1P0 female at 2849w6d being admitted for postdates IOL.   Plan:  1. Admit to Labor & Delivery  2. CBC, T&S, Clrs, IVF 3. GBS negative.   4. Fetal well-being: Category I 5. Cervidil for ripening   Carla Vincent, CNM 06/29/2016 9:33 PM

## 2016-06-30 LAB — CBC
HCT: 33.1 % — ABNORMAL LOW (ref 35.0–47.0)
HEMOGLOBIN: 10.8 g/dL — AB (ref 12.0–16.0)
MCH: 25.9 pg — AB (ref 26.0–34.0)
MCHC: 32.7 g/dL (ref 32.0–36.0)
MCV: 79.3 fL — ABNORMAL LOW (ref 80.0–100.0)
Platelets: 136 10*3/uL — ABNORMAL LOW (ref 150–440)
RBC: 4.18 MIL/uL (ref 3.80–5.20)
RDW: 15 % — AB (ref 11.5–14.5)
WBC: 20.5 10*3/uL — ABNORMAL HIGH (ref 3.6–11.0)

## 2016-06-30 LAB — COMPREHENSIVE METABOLIC PANEL
ALBUMIN: 2.7 g/dL — AB (ref 3.5–5.0)
ALK PHOS: 178 U/L — AB (ref 38–126)
ALT: 12 U/L — AB (ref 14–54)
ANION GAP: 8 (ref 5–15)
AST: 16 U/L (ref 15–41)
BILIRUBIN TOTAL: 0.7 mg/dL (ref 0.3–1.2)
CALCIUM: 8.7 mg/dL — AB (ref 8.9–10.3)
CO2: 20 mmol/L — AB (ref 22–32)
CREATININE: 0.4 mg/dL — AB (ref 0.44–1.00)
Chloride: 106 mmol/L (ref 101–111)
GFR calc Af Amer: >60 mL/min (ref >60)
GFR calc non Af Amer: >60 mL/min (ref >60)
GLUCOSE: 104 mg/dL — AB (ref 65–99)
Potassium: 3.5 mmol/L (ref 3.5–5.1)
SODIUM: 134 mmol/L — AB (ref 135–145)
TOTAL PROTEIN: 6.4 g/dL — AB (ref 6.5–8.1)

## 2016-06-30 MED ORDER — ACETAMINOPHEN 325 MG PO TABS: 650.0000 mg | ORAL_TABLET | ORAL | Status: DC | PRN

## 2016-06-30 MED ORDER — DIPHENHYDRAMINE HCL 25 MG PO CAPS: 25.0000 mg | ORAL_CAPSULE | Freq: Four times a day (QID) | ORAL | Status: DC | PRN

## 2016-06-30 MED ORDER — BUTORPHANOL TARTRATE 1 MG/ML IJ SOLN
1.0000 mg | INTRAMUSCULAR | Status: DC | PRN
  Administered 2016-06-30: 2 mg via INTRAVENOUS

## 2016-06-30 MED ORDER — AMMONIA AROMATIC IN INHA
RESPIRATORY_TRACT | Status: AC
  Filled 2016-06-30: qty 10

## 2016-06-30 MED ORDER — SIMETHICONE 80 MG PO CHEW: 80.0000 mg | CHEWABLE_TABLET | ORAL | Status: DC | PRN

## 2016-06-30 MED ORDER — SENNOSIDES-DOCUSATE SODIUM 8.6-50 MG PO TABS
2.0000 | ORAL_TABLET | ORAL | Status: DC
  Administered 2016-07-01: 2 via ORAL
  Filled 2016-06-30: qty 2

## 2016-06-30 MED ORDER — TERBUTALINE SULFATE 1 MG/ML IJ SOLN: 0.2500 mg | Freq: Once | INTRAMUSCULAR | Status: DC | PRN

## 2016-06-30 MED ORDER — BUTORPHANOL TARTRATE 1 MG/ML IJ SOLN
INTRAMUSCULAR | Status: AC
  Administered 2016-06-30: 2 mg via INTRAVENOUS
  Filled 2016-06-30: qty 2

## 2016-06-30 MED ORDER — PRENATAL MULTIVITAMIN CH
1.0000 | ORAL_TABLET | Freq: Every day | ORAL | Status: DC
  Administered 2016-07-01 – 2016-07-02 (×2): 1 via ORAL
  Filled 2016-06-30 (×2): qty 1

## 2016-06-30 MED ORDER — SODIUM CHLORIDE FLUSH 0.9 % IV SOLN
INTRAVENOUS | Status: AC
  Filled 2016-06-30: qty 20

## 2016-06-30 MED ORDER — BENZOCAINE-MENTHOL 20-0.5 % EX AERO: 1.0000 "application " | INHALATION_SPRAY | CUTANEOUS | Status: DC | PRN

## 2016-06-30 MED ORDER — WITCH HAZEL-GLYCERIN EX PADS: 1.0000 "application " | MEDICATED_PAD | CUTANEOUS | Status: DC | PRN

## 2016-06-30 MED ORDER — COCONUT OIL OIL
1.0000 "application " | TOPICAL_OIL | Status: DC | PRN
  Administered 2016-06-30: 1 via TOPICAL
  Filled 2016-06-30: qty 120

## 2016-06-30 MED ORDER — DIBUCAINE 1 % RE OINT: 1.0000 "application " | TOPICAL_OINTMENT | RECTAL | Status: DC | PRN

## 2016-06-30 MED ORDER — ONDANSETRON HCL 4 MG/2ML IJ SOLN: 4.0000 mg | INTRAMUSCULAR | Status: DC | PRN

## 2016-06-30 MED ORDER — OXYTOCIN 40 UNITS IN LACTATED RINGERS INFUSION - SIMPLE MED
1.0000 m[IU]/min | INTRAVENOUS | Status: DC
  Administered 2016-06-30: 2 m[IU]/min via INTRAVENOUS

## 2016-06-30 MED ORDER — ONDANSETRON HCL 4 MG PO TABS: 4.0000 mg | ORAL_TABLET | ORAL | Status: DC | PRN

## 2016-06-30 MED ORDER — IBUPROFEN 600 MG PO TABS
600.0000 mg | ORAL_TABLET | Freq: Four times a day (QID) | ORAL | Status: DC
  Administered 2016-06-30 – 2016-07-02 (×5): 600 mg via ORAL
  Filled 2016-06-30 (×7): qty 1

## 2016-06-30 MED ORDER — LIDOCAINE HCL (PF) 1 % IJ SOLN
INTRAMUSCULAR | Status: AC
  Filled 2016-06-30: qty 30

## 2016-06-30 MED ORDER — MISOPROSTOL 200 MCG PO TABS
ORAL_TABLET | ORAL | Status: AC
  Filled 2016-06-30: qty 4

## 2016-06-30 MED ORDER — OXYCODONE-ACETAMINOPHEN 5-325 MG PO TABS: 2.0000 | ORAL_TABLET | ORAL | Status: DC | PRN

## 2016-06-30 MED ORDER — OXYCODONE-ACETAMINOPHEN 5-325 MG PO TABS: 1.0000 | ORAL_TABLET | ORAL | Status: DC | PRN

## 2016-06-30 NOTE — Discharge Summary (Addendum)
Obstetric Discharge Summary Reason for Admission: induction of labor Prenatal Procedures: none Intrapartum Procedures: spontaneous vaginal delivery Postpartum Procedures: none Complications-Operative and Postpartum: Gestational hypertension Hemoglobin  Date Value Ref Range Status  07/01/2016 9.3 (L) 12.0 - 16.0 g/dL Final   HGB  Date Value Ref Range Status  08/18/2013 14.2 12.0 - 16.0 g/dL Final   HCT  Date Value Ref Range Status  07/01/2016 27.8 (L) 35.0 - 47.0 % Final  08/18/2013 41.7 35.0 - 47.0 % Final    Physical Exam:  General: alert, appears stated age and no distress Lochia: appropriate Uterine Fundus: firm DVT Evaluation: No evidence of DVT seen on physical exam.  Discharge Diagnoses: Term Pregnancy-delivered  Discharge Information: Date: 07/02/2016 Activity: pelvic rest Diet: routine Medications: PNV, Ibuprofen and Iron Condition: stable Discharge to: home Follow-up Information    Lorrene ReidSTAEBLER, ANDREAS M, MD Follow up in 1 week(s).   Specialty:  Obstetrics and Gynecology Why:  1 week BP check, and 6 week postpartum visit Contact information: [address] [place] [address] 978 [phone]           Carla LibraRobert Paul Erasmo Vincent 07/02/2016, 9:46 AM

## 2016-06-30 NOTE — Progress Notes (Signed)
Subjective:  Doing well, painfully contracting  Objective:   Vitals: Blood pressure (!) 152/77, pulse 70, temperature 98.2 F (36.8 C), resp. rate 18, height 5\' 2"  (1.575 m), weight 68.9 kg (152 lb), last menstrual period 09/17/2015. General: painfully contracting Abdomen: gravid, non-tender Cervical Exam:  Dilation: 8 Effacement (%): 80 Station: -1 Presentation: Vertex Exam by:: ams Position confirmed to be ROA  FHT: 130, moderate, +accels, no decels Toco: q2-583min  Results for orders placed or performed during the hospital encounter of 06/29/16 (from the past 24 hour(s))  CBC     Status: Abnormal   Collection Time: 06/29/16  8:50 PM  Result Value Ref Range   WBC 12.1 (H) 3.6 - 11.0 K/uL   RBC 3.77 (L) 3.80 - 5.20 MIL/uL   Hemoglobin 10.1 (L) 12.0 - 16.0 g/dL   HCT 16.129.5 (L) 09.635.0 - 04.547.0 %   MCV 78.1 (L) 80.0 - 100.0 fL   MCH 26.7 26.0 - 34.0 pg   MCHC 34.2 32.0 - 36.0 g/dL   RDW 40.915.0 (H) 81.111.5 - 91.414.5 %   Platelets 159 150 - 440 K/uL  Type and screen [facility]     Status: None   Collection Time: 06/29/16  8:50 PM  Result Value Ref Range   ABO/RH(D) A POS    Antibody Screen NEG    Sample Expiration 07/02/2016   Urine Drug Screen, Qualitative ([facility] only)     Status: None   Collection Time: 06/29/16  8:50 PM  Result Value Ref Range   Tricyclic, Ur Screen NONE DETECTED NONE DETECTED   Amphetamines, Ur Screen NONE DETECTED NONE DETECTED   MDMA (Ecstasy)Ur Screen NONE DETECTED NONE DETECTED   Cocaine Metabolite,Ur [place] NONE DETECTED NONE DETECTED   Opiate, Ur Screen NONE DETECTED NONE DETECTED   Phencyclidine (PCP) Ur S NONE DETECTED NONE DETECTED   Cannabinoid 50 Ng, Ur [place] NONE DETECTED NONE DETECTED   Barbiturates, Ur Screen NONE DETECTED NONE DETECTED   Benzodiazepine, Ur Scrn NONE DETECTED NONE DETECTED   Methadone Scn, Ur NONE DETECTED NONE DETECTED    Assessment:   25 y.o. G1P0 2727w0d postdates IOL   Plan:   1) Labor - continue to titrate up  pitocin as needed  2) Fetus -  ~15lbs weight gain this pregnancy - 1-hr 166 with 3-hr all normal 85 / 153 / 140 / 111 - EFW 8lbs  3) BP remain mild range - CBC, CMP and P/C ratio

## 2016-06-30 NOTE — Progress Notes (Signed)
Subjective:  Painful contractions  Objective:   Vitals: Blood pressure (!) 142/82, pulse 80, temperature 98.1 F (36.7 C), temperature source Oral, resp. rate 18, height 5\' 2"  (1.575 m), weight 68.9 kg (152 lb), last menstrual period 09/17/2015. General: NAD Abdomen: gravid, non-tender Cervical Exam:  Dilation: 4 Effacement (%): 80 Station: -1 Presentation: Vertex Exam by:: AMS  FHT: 140, mod, +accels, no decels Toco: q852min  Results for orders placed or performed during the hospital encounter of 06/29/16 (from the past 24 hour(s))  CBC     Status: Abnormal   Collection Time: 06/29/16  8:50 PM  Result Value Ref Range   WBC 12.1 (H) 3.6 - 11.0 K/uL   RBC 3.77 (L) 3.80 - 5.20 MIL/uL   Hemoglobin 10.1 (L) 12.0 - 16.0 g/dL   HCT 96.029.5 (L) 45.435.0 - 09.847.0 %   MCV 78.1 (L) 80.0 - 100.0 fL   MCH 26.7 26.0 - 34.0 pg   MCHC 34.2 32.0 - 36.0 g/dL   RDW 11.915.0 (H) 14.711.5 - 82.914.5 %   Platelets 159 150 - 440 K/uL  Type and screen [facility]     Status: None   Collection Time: 06/29/16  8:50 PM  Result Value Ref Range   ABO/RH(D) A POS    Antibody Screen NEG    Sample Expiration 07/02/2016   Urine Drug Screen, Qualitative ([facility] only)     Status: None   Collection Time: 06/29/16  8:50 PM  Result Value Ref Range   Tricyclic, Ur Screen NONE DETECTED NONE DETECTED   Amphetamines, Ur Screen NONE DETECTED NONE DETECTED   MDMA (Ecstasy)Ur Screen NONE DETECTED NONE DETECTED   Cocaine Metabolite,Ur [place] NONE DETECTED NONE DETECTED   Opiate, Ur Screen NONE DETECTED NONE DETECTED   Phencyclidine (PCP) Ur S NONE DETECTED NONE DETECTED   Cannabinoid 50 Ng, Ur [place] NONE DETECTED NONE DETECTED   Barbiturates, Ur Screen NONE DETECTED NONE DETECTED   Benzodiazepine, Ur Scrn NONE DETECTED NONE DETECTED   Methadone Scn, Ur NONE DETECTED NONE DETECTED    Assessment:   25 y.o. G1P0 3963w0d   Plan:   1) Labor - monitor for cervical change and contractions titrate pitocin as needed  2)  Fetus - cat I tracing  3) BP - some mild range BP's monitor, patient asymptomatic

## 2016-06-30 NOTE — Progress Notes (Signed)
Subjective:  Doing well, no concerns  Objective:   Vitals: Blood pressure 131/72, pulse 81, temperature 98.2 F (36.8 C), temperature source Oral, resp. rate 18, height 5\' 2"  (1.575 m), weight 68.9 kg (152 lb), last menstrual period 09/17/2015. General: NAD Abdomen: Gravid, non-tender Cervical Exam:  Dilation: 4 Effacement (%): 60, 70 Station: -2 Presentation: Vertex Exam by:: jac  FHT: 150, moderate, +accels, no decels Toco: none  Results for orders placed or performed during the hospital encounter of 06/29/16 (from the past 24 hour(s))  CBC     Status: Abnormal   Collection Time: 06/29/16  8:50 PM  Result Value Ref Range   WBC 12.1 (H) 3.6 - 11.0 K/uL   RBC 3.77 (L) 3.80 - 5.20 MIL/uL   Hemoglobin 10.1 (L) 12.0 - 16.0 g/dL   HCT 40.929.5 (L) 81.135.0 - 91.447.0 %   MCV 78.1 (L) 80.0 - 100.0 fL   MCH 26.7 26.0 - 34.0 pg   MCHC 34.2 32.0 - 36.0 g/dL   RDW 78.215.0 (H) 95.611.5 - 21.314.5 %   Platelets 159 150 - 440 K/uL  Type and screen [facility]     Status: None   Collection Time: 06/29/16  8:50 PM  Result Value Ref Range   ABO/RH(D) A POS    Antibody Screen NEG    Sample Expiration 07/02/2016   Urine Drug Screen, Qualitative ([facility] only)     Status: None   Collection Time: 06/29/16  8:50 PM  Result Value Ref Range   Tricyclic, Ur Screen NONE DETECTED NONE DETECTED   Amphetamines, Ur Screen NONE DETECTED NONE DETECTED   MDMA (Ecstasy)Ur Screen NONE DETECTED NONE DETECTED   Cocaine Metabolite,Ur [place] NONE DETECTED NONE DETECTED   Opiate, Ur Screen NONE DETECTED NONE DETECTED   Phencyclidine (PCP) Ur S NONE DETECTED NONE DETECTED   Cannabinoid 50 Ng, Ur [place] NONE DETECTED NONE DETECTED   Barbiturates, Ur Screen NONE DETECTED NONE DETECTED   Benzodiazepine, Ur Scrn NONE DETECTED NONE DETECTED   Methadone Scn, Ur NONE DETECTED NONE DETECTED    Assessment:   25 y.o. G1P0 [id number] IOL postdates  Plan:   1) Labor - start pitocin, AROM once in contraction pattern  2) Fetus  - cat I tracing

## 2016-07-01 LAB — CBC
HEMATOCRIT: 27.8 % — AB (ref 35.0–47.0)
Hemoglobin: 9.3 g/dL — ABNORMAL LOW (ref 12.0–16.0)
MCH: 26.1 pg (ref 26.0–34.0)
MCHC: 33.3 g/dL (ref 32.0–36.0)
MCV: 78.3 fL — AB (ref 80.0–100.0)
PLATELETS: 158 10*3/uL (ref 150–440)
RBC: 3.55 MIL/uL — AB (ref 3.80–5.20)
RDW: 15 % — ABNORMAL HIGH (ref 11.5–14.5)
WBC: 21 10*3/uL — AB (ref 3.6–11.0)

## 2016-07-01 LAB — RPR: RPR: NONREACTIVE

## 2016-07-01 MED ORDER — MEASLES, MUMPS & RUBELLA VAC ~~LOC~~ INJ
0.5000 mL | INJECTION | Freq: Once | SUBCUTANEOUS | Status: AC
  Administered 2016-07-02: 0.5 mL via SUBCUTANEOUS
  Filled 2016-07-01 (×2): qty 0.5

## 2016-07-01 MED ORDER — VARICELLA VIRUS VACCINE LIVE 1350 PFU/0.5ML IJ SUSR
0.5000 mL | INTRAMUSCULAR | Status: AC | PRN
  Administered 2016-07-02: 0.5 mL via SUBCUTANEOUS
  Filled 2016-07-01: qty 0.5

## 2016-07-01 NOTE — Progress Notes (Signed)
  Postpartum Day 1  Subjective: no complaints, up ad lib, voiding and tolerating PO  Objective: Blood pressure 119/66, pulse 72, temperature 98.7 F (37.1 C), temperature source Oral, resp. rate 16, height 5\' 2"  (1.575 m), weight 152 lb (68.9 kg), last menstrual period 09/17/2015, SpO2 98 %  Physical Exam:  General: alert and cooperative Lochia: appropriate Uterine Fundus: firm Incision: N/A DVT Evaluation: No evidence of DVT seen on physical exam. Abdomen: soft, NT   Recent Labs  06/30/16 1835 07/01/16 0424  HGB 10.8* 9.3*  HCT 33.1* 27.8*    Assessment PPD #1, acute blood loss anemia  Plan: Discharge tomorrow and Continue PP care  Feeding: breast Contraception: minipill Blood Type: A+ RNI/VNI - vaccines discussed, ordered for tomorrow TDAP UTD    Marta AntuBrothers, Welden Hausmann, CNM 07/01/2016, 8:53 AM

## 2016-07-02 MED ORDER — FERRALET 90 90-1 MG PO TABS: 1.0000 | ORAL_TABLET | Freq: Every day | ORAL | 1 refills | Status: DC

## 2016-07-02 MED ORDER — NORETHINDRONE 0.35 MG PO TABS: 1.0000 | ORAL_TABLET | Freq: Every day | ORAL | 11 refills | Status: DC

## 2016-07-02 NOTE — Progress Notes (Signed)
Parents viewed the Period Purple Cry.

## 2016-07-02 NOTE — Progress Notes (Signed)
  Postpartum Day 2  Subjective: no complaints, up ad lib, voiding and tolerating PO  Objective: Blood pressure 119/66, pulse 72, temperature 98.7 F (37.1 C), temperature source Oral, resp. rate 16, height 5\' 2"  (1.575 m), weight 152 lb (68.9 kg), last menstrual period 09/17/2015, SpO2 98 %  Physical Exam:  General: alert and cooperative Lochia: appropriate Uterine Fundus: firm Incision: N/A DVT Evaluation: No evidence of DVT seen on physical exam. Abdomen: soft, NT   Recent Labs  06/30/16 1835 07/01/16 0424  HGB 10.8* 9.3*  HCT 33.1* 27.8*    Assessment PPD #2, Vaginal DeliveryPlan: Discharge  Feeding: breast Contraception: minipill Blood Type: A+ RNI/VNI - vaccines today TDAP UTD    Carla Libraobert Paul Keirsten Matuska, MD 07/02/2016, 9:41 AM

## 2016-07-02 NOTE — Discharge Instructions (Signed)
Vaginal Delivery, Care After °Refer to this sheet in the next few weeks. These discharge instructions provide you with information on caring for yourself after delivery. Your health care provider may also give you specific instructions. Your treatment has been planned according to the most current medical practices available, but problems sometimes occur. Call your health care provider if you have any problems or questions after you go home. °HOME CARE INSTRUCTIONS °· Take over-the-counter or prescription medicines only as directed by your health care provider or pharmacist. °· Do not drink alcohol, especially if you are breastfeeding or taking medicine to relieve pain. °· Do not chew or smoke tobacco. °· Do not use illegal drugs. °· Continue to use good perineal care. Good perineal care includes: °¨ Wiping your perineum from front to back. °¨ Keeping your perineum clean. °· Do not use tampons or douche until your health care provider says it is okay. °· Shower, wash your hair, and take tub baths as directed by your health care provider. °· Wear a well-fitting bra that provides breast support. °· Eat healthy foods. °· Drink enough fluids to keep your urine clear or pale yellow. °· Eat high-fiber foods such as whole grain cereals and breads, brown rice, beans, and fresh fruits and vegetables every day. These foods may help prevent or relieve constipation. °· Follow your health care provider's recommendations regarding resumption of activities such as climbing stairs, driving, lifting, exercising, or traveling. °· Talk to your health care provider about resuming sexual activities. Resumption of sexual activities is dependent upon your risk of infection, your rate of healing, and your comfort and desire to resume sexual activity. °· Try to have someone help you with your household activities and your newborn for at least a few days after you leave the hospital. °· Rest as much as possible. Try to rest or take a nap  when your newborn is sleeping. °· Increase your activities gradually. °· Keep all of your scheduled postpartum appointments. It is very important to keep your scheduled follow-up appointments. At these appointments, your health care provider will be checking to make sure that you are healing physically and emotionally. °SEEK MEDICAL CARE IF:  °· You are passing large clots from your vagina. Save any clots to show your health care provider. °· You have a foul smelling discharge from your vagina. °· You have trouble urinating. °· You are urinating frequently. °· You have pain when you urinate. °· You have a change in your bowel movements. °· You have increasing redness, pain, or swelling near your vaginal incision (episiotomy) or vaginal tear. °· You have pus draining from your episiotomy or vaginal tear. °· Your episiotomy or vaginal tear is separating. °· You have painful, hard, or reddened breasts. °· You have a severe headache. °· You have blurred vision or see spots. °· You feel sad or depressed. °· You have thoughts of hurting yourself or your newborn. °· You have questions about your care, the care of your newborn, or medicines. °· You are dizzy or light-headed. °· You have a rash. °· You have nausea or vomiting. °· You were breastfeeding and have not had a menstrual period within 12 weeks after you stopped breastfeeding. °· You are not breastfeeding and have not had a menstrual period by the 12th week after delivery. °· You have a fever. °SEEK IMMEDIATE MEDICAL CARE IF:  °· You have persistent pain. °· You have chest pain. °· You have shortness of breath. °· You faint. °· You   have leg pain.  You have stomach pain.  Your vaginal bleeding saturates two or more sanitary pads in 1 hour.   This information is not intended to replace advice given to you by your health care provider. Make sure you discuss any questions you have with your health care provider.   Document Released: 10/08/2000 Document Revised:  07/02/2015 Document Reviewed: 06/07/2012 Elsevier Interactive Patient Education 2016 [employer].  micronorNorethindrone tablets (contraception) What is this medicine? NORETHINDRONE (nor eth IN drone) is an oral contraceptive. The product contains a female hormone known as a progestin. It is used to prevent pregnancy. This medicine may be used for other purposes; ask your health care provider or pharmacist if you have questions. What should I tell my health care provider before I take this medicine? They need to know if you have any of these conditions: -blood vessel disease or blood clots -breast, cervical, or vaginal cancer -diabetes -heart disease -kidney disease -liver disease -mental depression -migraine -seizures -stroke -vaginal bleeding -an unusual or allergic reaction to norethindrone, other medicines, foods, dyes, or preservatives -pregnant or trying to get pregnant -breast-feeding How should I use this medicine? Take this medicine by mouth with a glass of water. You may take it with or without food. Follow the directions on the prescription label. Take this medicine at the same time each day and in the order directed on the package. Do not take your medicine more often than directed. Contact your pediatrician regarding the use of this medicine in children. Special care may be needed. This medicine has been used in female children who have started having menstrual periods. A patient package insert for the product will be given with each prescription and refill. Read this sheet carefully each time. The sheet may change frequently. Overdosage: If you think you have taken too much of this medicine contact a poison control center or emergency room at once. NOTE: This medicine is only for you. Do not share this medicine with others. What if I miss a dose? Try not to miss a dose. Every time you miss a dose or take a dose late your chance of pregnancy increases. When 1 pill is  missed (even if only 3 hours late), take the missed pill as soon as possible and continue taking a pill each day at the regular time (use a back up method of birth control for the next 48 hours). If more than 1 dose is missed, use an additional birth control method for the rest of your pill pack until menses occurs. Contact your health care professional if more than 1 dose has been missed. What may interact with this medicine? Do not take this medicine with any of the following medications: -amprenavir or fosamprenavir -bosentan This medicine may also interact with the following medications: -antibiotics or medicines for infections, especially rifampin, rifabutin, rifapentine, and griseofulvin, and possibly penicillins or tetracyclines -aprepitant -barbiturate medicines, such as phenobarbital -carbamazepine -felbamate -modafinil -oxcarbazepine -phenytoin -ritonavir or other medicines for HIV infection or AIDS -St. John's wort -topiramate This list may not describe all possible interactions. Give your health care provider a list of all the medicines, herbs, non-prescription drugs, or dietary supplements you use. Also tell them if you smoke, drink alcohol, or use illegal drugs. Some items may interact with your medicine. What should I watch for while using this medicine? Visit your doctor or health care professional for regular checks on your progress. You will need a regular breast and pelvic exam and Pap smear while  on this medicine. Use an additional method of birth control during the first cycle that you take these tablets. If you have any reason to think you are pregnant, stop taking this medicine right away and contact your doctor or health care professional. If you are taking this medicine for hormone related problems, it may take several cycles of use to see improvement in your condition. This medicine does not protect you against HIV infection (AIDS) or any other sexually transmitted  diseases. What side effects may I notice from receiving this medicine? Side effects that you should report to your doctor or health care professional as soon as possible: -breast tenderness or discharge -pain in the abdomen, chest, groin or leg -severe headache -skin rash, itching, or hives -sudden shortness of breath -unusually weak or tired -vision or speech problems -yellowing of skin or eyes Side effects that usually do not require medical attention (report to your doctor or health care professional if they continue or are bothersome): -changes in sexual desire -change in menstrual flow -facial hair growth -fluid retention and swelling -headache -irritability -nausea -weight gain or loss This list may not describe all possible side effects. Call your doctor for medical advice about side effects. You may report side effects to FDA at 1-800-FDA-1088. Where should I keep my medicine? Keep out of the reach of children. Store at room temperature between 15 and 30 degrees C (59 and 86 degrees F). Throw away any unused medicine after the expiration date. NOTE: This sheet is a summary. It may not cover all possible information. If you have questions about this medicine, talk to your doctor, pharmacist, or health care provider.    2016, Elsevier/Gold Standard. (2012-06-30 16:41:35)

## 2017-07-04 ENCOUNTER — Emergency Department
Admission: EM | Admit: 2017-07-04 | Discharge: 2017-07-04 | Disposition: A | Discharge status: Home / Self Care | Payer: Medicaid Other | Attending: Emergency Medicine | Admitting: Emergency Medicine

## 2017-07-04 ENCOUNTER — Encounter: Payer: Self-pay | Admitting: Emergency Medicine

## 2017-07-04 DIAGNOSIS — T63301A Toxic effect of unspecified spider venom, accidental (unintentional), initial encounter: Secondary | ICD-10-CM | POA: Insufficient documentation

## 2017-07-04 DIAGNOSIS — Z79899 Other long term (current) drug therapy: Secondary | ICD-10-CM | POA: Insufficient documentation

## 2017-07-04 MED ORDER — CEPHALEXIN 500 MG PO CAPS: 500.0000 mg | ORAL_CAPSULE | Freq: Four times a day (QID) | ORAL | 0 refills | Status: AC

## 2017-07-04 NOTE — ED Notes (Signed)
Pt states she thinks that she got bit by a spider about 4 hours on her left pointer finger. She was walking her dogs and she felt a web on her, shortly after her finger got a bubble, she felt hot, and it was red and itchy. Family at the bedside.

## 2017-07-04 NOTE — ED Triage Notes (Signed)
Pt c/o insect bite to the left index finger. Per pt, she ran into a spider web earlier today and afterwards noticed a raised area to the index finger. Pt has a clear raised area with no redness or swelling around.

## 2017-07-04 NOTE — ED Provider Notes (Signed)
[facility] Emergency Department Provider Note  ____________________________________________  Time seen: Approximately 10:42 PM  I have reviewed the triage vital signs and the nursing notes.   HISTORY  Chief Complaint Insect Bite    HPI [name] is a 26 y.o. female who presents emergency department complaining of possible spider biteto her left index finger. Patient reports that she will throw spider web approximately 5 hours prior and developed a small blister to the lateral aspect of the second digit left hand. No pain to the area. This occurred approximately 5 hours prior to arrival with no increase in size blister. No numbness or tingling in the digit. Full range of motion to the digit. No ascending muscle spasms. No other complaints at this time. No medications prior to arrival.   Past Medical History:  Diagnosis Date  . Renal disorder     Patient Active Problem List   Diagnosis Date Noted  . Labor and delivery, indication for care 06/28/2016  . Nausea & vomiting 05/09/2016  . Left lower quadrant abdominal tenderness 03/15/2016    History reviewed. No pertinent surgical history.  Prior to Admission medications   Medication Sig Start Date End Date Taking? Authorizing Provider  cephALEXin (KEFLEX) 500 MG capsule Take 1 capsule (500 mg total) by mouth 4 (four) times daily. 07/04/17 07/14/17  Merrilyn Legler, Delorise Royals, PA-C  Fe Cbn-Fe Gluc-FA-B12-C-DSS (FERRALET 90) 90-1 MG TABS Take 1 tablet by mouth daily. 07/02/16   Nadara Mustard, MD  norethindrone (ORTHO MICRONOR) 0.35 MG tablet Take 1 tablet (0.35 mg total) by mouth daily. 07/02/16   Nadara Mustard, MD  Prenatal MV & Min w/FA-DHA (PRENATAL ADULT GUMMY/DHA/FA) 0.4-25 MG CHEW Chew 2 each by mouth every morning.    [provider]    Allergies Patient has no known allergies.  History reviewed. No pertinent family history.  Social History Social History  Substance Use Topics  .  Smoking status: Never Smoker  . Smokeless tobacco: Never Used  . Alcohol use No     Review of Systems  Constitutional: No fever/chills Eyes: No visual changes. No discharge ENT: No upper respiratory complaints. Cardiovascular: no chest pain. Respiratory: no cough. No SOB. Gastrointestinal: No abdominal pain.  No nausea, no vomiting.  No diarrhea.  No constipation. Musculoskeletal: Negative for musculoskeletal pain. Skin: Negative for rash, abrasions, lacerations, ecchymosis.positive for small posterior to the lateral aspect of the second digit left hand. Neurological: Negative for headaches, focal weakness or numbness. 10-point ROS otherwise negative.  ____________________________________________   PHYSICAL EXAM:  VITAL SIGNS: ED Triage Vitals  Enc Vitals Group     BP 07/04/17 2124 132/81     Pulse Rate 07/04/17 2124 72     Resp 07/04/17 2124 16     Temp 07/04/17 2124 97.9 F (36.6 C)     Temp Source 07/04/17 2124 Oral     SpO2 07/04/17 2124 100 %     Weight 07/04/17 2121 152 lb (68.9 kg)     Height --      Head Circumference --      Peak Flow --      Pain Score --      Pain Loc --      Pain Edu? --      Excl. in GC? --      Constitutional: Alert and oriented. Well appearing and in no acute distress. Eyes: Conjunctivae are normal. PERRL. EOMI. Head: Atraumatic. Neck: No stridor.    Cardiovascular: Normal rate, regular rhythm.  Normal S1 and S2.  Good peripheral circulation. Respiratory: Normal respiratory effort without tachypnea or retractions. Lungs CTAB. Good air entry to the bases with no decreased or absent breath sounds. Musculoskeletal: Full range of motion to all extremities. No gross deformities appreciated. Neurologic:  Normal speech and language. No gross focal neurologic deficits are appreciated.  Skin:  Skin is warm, dry and intact. No rash noted.small vesiculation noted to the lateral aspect of the second digit left hand. No central punctate lesion. No  surrounding erythema or edema. Full range of motion to the second digit of the left hand. Sensation and cap refill intact distally. Psychiatric: Mood and affect are normal. Speech and behavior are normal. Patient exhibits appropriate insight and judgement.   ____________________________________________   LABS (all labs ordered are listed, but only abnormal results are displayed)  Labs Reviewed - No data to display ____________________________________________  EKG   ____________________________________________  RADIOLOGY   No results found.  ____________________________________________    PROCEDURES  Procedure(s) performed:    Procedures    Medications - No data to display   ____________________________________________   INITIAL IMPRESSION / ASSESSMENT AND PLAN / ED COURSE  Pertinent labs & imaging results that were available during my care of the patient were reviewed by me and considered in my medical decision making (see chart for details).  Review of the [place] CSRS was performed in accordance of the [facility] prior to dispensing any controlled drugs.     Patient's diagnosis is consistent with possible spider bite to the second digit of left hand. Patient presented with small vesiculation consistent with possible spider bite. At this time, no indication of black widow or brown recluse spider bite. However, patient is provided a prescription for antibiotics should vesiculation suddenly worsen, forearm erythema and edema or abscess looking wound. If this occurs, patient will start antibiotics. Otherwise, patient discard prescription.. Patient will follow-up with primary care as needed. Patient is given ED precautions to return to the ED for any worsening or new symptoms.     ____________________________________________  FINAL CLINICAL IMPRESSION(S) / ED DIAGNOSES  Final diagnoses:  Spider bite wound, accidental or unintentional, initial encounter      NEW  MEDICATIONS STARTED DURING THIS VISIT:  Discharge Medication List as of 07/04/2017 10:49 PM    START taking these medications   Details  cephALEXin (KEFLEX) 500 MG capsule Take 1 capsule (500 mg total) by mouth 4 (four) times daily., Starting Mon 07/04/2017, Until Thu 07/14/2017, Print            This chart was dictated using voice recognition software/Dragon. Despite best efforts to proofread, errors can occur which can change the meaning. Any change was purely unintentional.    Racheal PatchesCuthriell, Manjinder Breau D, PA-C 07/04/17 2337    Phineas SemenGoodman, [place], MD 07/04/17 [phone]
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
## 2017-11-09 ENCOUNTER — Ambulatory Visit: Admit: 2017-11-09 | Discharge: 2017-11-10 | Payer: MEDICAID | Attending: Family | Primary: Family

## 2017-11-09 DIAGNOSIS — R635 Abnormal weight gain: Secondary | ICD-10-CM

## 2017-11-09 DIAGNOSIS — L409 Psoriasis, unspecified: Principal | ICD-10-CM

## 2017-11-09 MED ORDER — TRIAMCINOLONE ACETONIDE 0.1 % TOPICAL CREAM
Freq: Two times a day (BID) | TOPICAL | 1 refills | 0.00000 days | Status: CP
Start: 2017-11-09 — End: 2018-11-09

## 2017-11-09 MED ORDER — CLOBETASOL 0.05 % SCALP SOLUTION
Freq: Two times a day (BID) | TOPICAL | 0 refills | 0.00000 days | Status: CP
Start: 2017-11-09 — End: 2017-11-16

## 2017-11-16 ENCOUNTER — Ambulatory Visit: Admit: 2017-11-16 | Discharge: 2017-11-17 | Payer: MEDICAID

## 2017-11-16 DIAGNOSIS — L409 Psoriasis, unspecified: Principal | ICD-10-CM

## 2017-11-16 DIAGNOSIS — B079 Viral wart, unspecified: Secondary | ICD-10-CM

## 2017-11-16 MED ORDER — CLOBETASOL 0.05 % SCALP SOLUTION
Freq: Two times a day (BID) | TOPICAL | 4 refills | 0.00000 days | Status: CP
Start: 2017-11-16 — End: 2018-07-10

## 2017-11-16 MED ORDER — CLOBETASOL 0.05 % TOPICAL OINTMENT
Freq: Two times a day (BID) | TOPICAL | 4 refills | 0 days | Status: CP
Start: 2017-11-16 — End: 2018-07-10

## 2017-11-21 ENCOUNTER — Encounter: Payer: Self-pay | Admitting: Emergency Medicine

## 2017-11-21 ENCOUNTER — Emergency Department
Admission: EM | Admit: 2017-11-21 | Discharge: 2017-11-22 | Disposition: A | Discharge status: Home / Self Care | Payer: Medicaid Other | Attending: Emergency Medicine | Admitting: Emergency Medicine

## 2017-11-21 ENCOUNTER — Other Ambulatory Visit: Payer: Self-pay

## 2017-11-21 DIAGNOSIS — Z79899 Other long term (current) drug therapy: Secondary | ICD-10-CM | POA: Insufficient documentation

## 2017-11-21 DIAGNOSIS — N938 Other specified abnormal uterine and vaginal bleeding: Secondary | ICD-10-CM | POA: Diagnosis present

## 2017-11-21 DIAGNOSIS — N926 Irregular menstruation, unspecified: Secondary | ICD-10-CM | POA: Diagnosis not present

## 2017-11-21 LAB — URINALYSIS, COMPLETE (UACMP) WITH MICROSCOPIC
Bacteria, UA: NONE SEEN
Bilirubin Urine: NEGATIVE
GLUCOSE, UA: NEGATIVE mg/dL
Ketones, ur: NEGATIVE mg/dL
Leukocytes, UA: NEGATIVE
NITRITE: NEGATIVE
PROTEIN: NEGATIVE mg/dL
SPECIFIC GRAVITY, URINE: 1.021 (ref 1.005–1.030)
pH: 5 (ref 5.0–8.0)

## 2017-11-21 LAB — POCT PREGNANCY, URINE: Preg Test, Ur: NEGATIVE

## 2017-11-21 NOTE — ED Provider Notes (Signed)
[facility] Emergency Department Provider Note   ____________________________________________    I have reviewed the triage vital signs and the nursing notes.   HISTORY  Chief Complaint Vaginal Bleeding     HPI Carla Vincent is a 27 y.o. female who presents with complaints of vaginal bleeding.  Patient reports that she recently started her menstrual cycle and today she passed a stringy substance today which made her quite concerned that she may have had a miscarriage.  Her menstrual cycle was a couple of days late but she did not believe that she was pregnant, however after she passed this material she became very upset.  She describes normal pelvic cramping as usual for her menstruation   Past Medical History:  Diagnosis Date  . Renal disorder     Patient Active Problem List   Diagnosis Date Noted  . Labor and delivery, indication for care 06/28/2016  . Nausea & vomiting 05/09/2016  . Left lower quadrant abdominal tenderness 03/15/2016    History reviewed. No pertinent surgical history.  Prior to Admission medications   Medication Sig Start Date End Date Taking? Authorizing Provider  Fe Cbn-Fe Gluc-FA-B12-C-DSS (FERRALET 90) 90-1 MG TABS Take 1 tablet by mouth daily. 07/02/16   Nadara MustardHarris, Brandyce Dimario P, MD  norethindrone (ORTHO MICRONOR) 0.35 MG tablet Take 1 tablet (0.35 mg total) by mouth daily. 07/02/16   Nadara MustardHarris, Dorris Pierre P, MD  Prenatal MV & Min w/FA-DHA (PRENATAL ADULT GUMMY/DHA/FA) 0.4-25 MG CHEW Chew 2 each by mouth every morning.    [provider]     Allergies Patient has no known allergies.  No family history on file.  Social History Social History   Tobacco Use  . Smoking status: Never Smoker  . Smokeless tobacco: Never Used  Substance Use Topics  . Alcohol use: Yes  . Drug use: Yes    Types: Marijuana    Comment: one time; about 4-5 months ago    Review of Systems  Constitutional: No dizziness Eyes: No visual  changes.  ENT: No sore throat. Cardiovascular: Denies chest pain. Respiratory: Denies cough. Gastrointestinal: No nausea, no vomiting.   Genitourinary: As above Musculoskeletal: Negative for back pain. Skin: Negative for rash. Neurological: Negative for headaches   ____________________________________________   PHYSICAL EXAM:  VITAL SIGNS: ED Triage Vitals  Enc Vitals Group     BP 11/21/17 2222 139/89     Pulse Rate 11/21/17 2222 79     Resp 11/21/17 2222 18     Temp 11/21/17 2222 99 F (37.2 C)     Temp Source 11/21/17 2222 Oral     SpO2 11/21/17 2222 98 %     Weight 11/21/17 2222 71.7 kg (158 lb)     Height 11/21/17 2222 1.575 m (5\' 2" )     Head Circumference --      Peak Flow --      Pain Score 11/21/17 2231 5     Pain Loc --      Pain Edu? --      Excl. in GC? --     Constitutional: Alert and oriented. No acute distress.  Eyes: Conjunctivae are normal.   Nose: No congestion/rhinnorhea. Mouth/Throat: Mucous membranes are moist.    Cardiovascular: Normal rate, regular rhythm.Good peripheral circulation. Respiratory: Normal respiratory effort.  No retractions.  Gastrointestinal: Soft and nontender. No distention.    Musculoskeletal: Warm and well perfused Neurologic:  Normal speech and language. No gross focal neurologic deficits are appreciated.  Skin:  Skin  is warm, dry and intact. No rash noted. Psychiatric: Mood and affect are normal. Speech and behavior are normal.  ____________________________________________   LABS (all labs ordered are listed, but only abnormal results are displayed)  Labs Reviewed  URINALYSIS, COMPLETE (UACMP) WITH MICROSCOPIC - Abnormal; Notable for the following components:      Result Value   Color, Urine YELLOW (*)    APPearance HAZY (*)    Hgb urine dipstick LARGE (*)    Squamous Epithelial / LPF 0-5 (*)    All other components within normal limits  POC URINE PREG, ED  POCT PREGNANCY, URINE    ____________________________________________  EKG  None ____________________________________________  RADIOLOGY  None ____________________________________________   PROCEDURES  Procedure(s) performed: No  Procedures   Critical Care performed: No ____________________________________________   INITIAL IMPRESSION / ASSESSMENT AND PLAN / ED COURSE  Pertinent labs & imaging results that were available during my care of the patient were reviewed by me and considered in my medical decision making (see chart for details).  Patient brought in sample with her of material which looks to be uterine lining.  Her pregnancy test is negative, I reassured her that she did not have a miscarriage.  She felt significantly better after this.  Appropriate for discharge at this time    ____________________________________________   FINAL CLINICAL IMPRESSION(S) / ED DIAGNOSES  Final diagnoses:  Irregular menstrual bleeding        Note:  This document was prepared using Dragon voice recognition software and may include unintentional dictation errors.    Jene Every, MD 11/21/17 [phone]

## 2017-11-21 NOTE — ED Triage Notes (Signed)
Pt presents to ED with possible miscarriage. Pt states her period was a couple days late "but not enough to take a pregnancy test" and reports her period started yesterday and appeared to be "normal" for her. Pt states she did not think she was pregnant but noticed "something very different after using the restroom". pt states she is not bleeding more than her normal. Not currently taking any birth control. Slight lower abd cramping and lower back pain. No distress noted.

## 2017-11-22 ENCOUNTER — Other Ambulatory Visit: Payer: Self-pay

## 2017-11-22 NOTE — ED Notes (Signed)
Pt verbalizes understanding of discharge instructions.

## 2017-11-22 NOTE — ED Notes (Signed)
Pt denies any discomfort at present talking to friend no distress noted left exam room ambulatory

## 2017-12-17 ENCOUNTER — Ambulatory Visit
Admission: EM | Admit: 2017-12-17 | Discharge: 2017-12-17 | Disposition: A | Discharge status: Home / Self Care | Payer: Medicaid Other | Attending: Orthopedic Surgery | Admitting: Orthopedic Surgery

## 2017-12-17 ENCOUNTER — Other Ambulatory Visit: Payer: Self-pay

## 2017-12-17 DIAGNOSIS — B9789 Other viral agents as the cause of diseases classified elsewhere: Secondary | ICD-10-CM

## 2017-12-17 DIAGNOSIS — Z79899 Other long term (current) drug therapy: Secondary | ICD-10-CM | POA: Insufficient documentation

## 2017-12-17 DIAGNOSIS — J069 Acute upper respiratory infection, unspecified: Secondary | ICD-10-CM | POA: Diagnosis not present

## 2017-12-17 DIAGNOSIS — R05 Cough: Secondary | ICD-10-CM | POA: Insufficient documentation

## 2017-12-17 DIAGNOSIS — J029 Acute pharyngitis, unspecified: Secondary | ICD-10-CM | POA: Diagnosis present

## 2017-12-17 LAB — RAPID STREP SCREEN (MED CTR MEBANE ONLY): STREPTOCOCCUS, GROUP A SCREEN (DIRECT): NEGATIVE

## 2017-12-17 LAB — RAPID INFLUENZA A&B ANTIGENS
Influenza A (ARMC): NEGATIVE
Influenza B (ARMC): NEGATIVE

## 2017-12-17 MED ORDER — PSEUDOEPH-BROMPHEN-DM 30-2-10 MG/5ML PO SYRP: 5.0000 mL | ORAL_SOLUTION | Freq: Three times a day (TID) | ORAL | 0 refills | Status: DC | PRN

## 2017-12-17 NOTE — Discharge Instructions (Signed)
Please take Bromfed cough syrup as prescribed.  You may also use additional Tylenol and ibuprofen as needed for body aches, fevers, sore throat pain.  If any worsening cough, shortness of breath, difficulty breathing or fevers above 102 return to the clinic or emergency department.

## 2017-12-17 NOTE — ED Triage Notes (Signed)
Patient is c/o sore throat, body aches, upper back pain, SOB, cough and chills since Thursday.

## 2017-12-17 NOTE — ED Provider Notes (Signed)
[facility]    CSN: [id number] Arrival date & time: 12/17/17  1445     History   Chief Complaint Chief Complaint  Patient presents with  . Sore Throat    HPI Carla Vincent is a 27 y.o. female presents to the urgent care facility for evaluation of stuffy nose congestion, headache, nonproductive cough, sore throat.  Symptoms began Thursday is just stuffy nose and congestion but today cough is developed.  She denies any chest pain, shortness of breath.  No abdominal pain nausea vomiting or diarrhea.  She is been taking DayQuil with mild improvement.  She denies any fevers but has had some chills with mild body aches.  HPI  Past Medical History:  Diagnosis Date  . Renal disorder     Patient Active Problem List   Diagnosis Date Noted  . Labor and delivery, indication for care 06/28/2016  . Nausea & vomiting 05/09/2016  . Left lower quadrant abdominal tenderness 03/15/2016    History reviewed. No pertinent surgical history.  OB History    Gravida Para Term Preterm AB Living   1 1 1     1    SAB TAB Ectopic Multiple Live Births         0 1       Home Medications    Prior to Admission medications   Medication Sig Start Date End Date Taking? Authorizing Provider  brompheniramine-pseudoephedrine-DM 30-2-10 MG/5ML syrup Take 5 mLs by mouth 3 (three) times daily as needed. 12/17/17   Evon Slack, PA-C  Fe Cbn-Fe Gluc-FA-B12-C-DSS (FERRALET 90) 90-1 MG TABS Take 1 tablet by mouth daily. 07/02/16   Nadara Mustard, MD  norethindrone (ORTHO MICRONOR) 0.35 MG tablet Take 1 tablet (0.35 mg total) by mouth daily. 07/02/16   Nadara Mustard, MD  Prenatal MV & Min w/FA-DHA (PRENATAL ADULT GUMMY/DHA/FA) 0.4-25 MG CHEW Chew 2 each by mouth every morning.    [provider]    Family History History reviewed. No pertinent family history.  Social History Social History   Tobacco Use  . Smoking status: Never Smoker  . Smokeless tobacco: Never Used    Substance Use Topics  . Alcohol use: Yes  . Drug use: Yes    Types: Marijuana    Comment: one time; about 4-5 months ago     Allergies   Patient has no known allergies.   Review of Systems Review of Systems  Constitutional: Positive for chills. Negative for fever.  HENT: Positive for congestion, rhinorrhea and sore throat. Negative for ear discharge, sinus pressure, sinus pain, trouble swallowing and voice change.   Respiratory: Positive for cough. Negative for shortness of breath, wheezing and stridor.   Cardiovascular: Negative for chest pain.  Gastrointestinal: Negative for abdominal pain, diarrhea, nausea and vomiting.  Genitourinary: Negative for dysuria, flank pain and pelvic pain.  Musculoskeletal: Positive for myalgias. Negative for back pain.  Skin: Negative for rash.  Neurological: Negative for dizziness and headaches.     Physical Exam Triage Vital Signs ED Triage Vitals  Enc Vitals Group     BP 12/17/17 1503 (!) 112/45     Pulse Rate 12/17/17 1503 92     Resp --      Temp 12/17/17 1503 98.4 F (36.9 C)     Temp Source 12/17/17 1503 Oral     SpO2 12/17/17 1503 99 %     Weight 12/17/17 1502 155 lb (70.3 kg)     Height 12/17/17 1502 5\' 2"  (1.575  m)     Head Circumference --      Peak Flow --      Pain Score 12/17/17 1501 8     Pain Loc --      Pain Edu? --      Excl. in GC? --    No data found.  Updated Vital Signs BP (!) 112/45 (BP Location: Left Arm)   Pulse 92   Temp 98.4 F (36.9 C) (Oral)   Ht 5\' 2"  (1.575 m)   Wt 155 lb (70.3 kg)   LMP 11/20/2017 (Exact Date)   SpO2 99%   BMI 28.35 kg/m   Visual Acuity Right Eye Distance:   Left Eye Distance:   Bilateral Distance:    Right Eye Near:   Left Eye Near:    Bilateral Near:     Physical Exam  Constitutional: She is oriented to person, place, and time. She appears well-developed and well-nourished. No distress.  HENT:  Head: Normocephalic and atraumatic.  Right Ear: Hearing, tympanic  membrane, external ear and ear canal normal.  Left Ear: Hearing, tympanic membrane, external ear and ear canal normal.  Nose: Rhinorrhea present.  Mouth/Throat: Uvula is midline, oropharynx is clear and moist and mucous membranes are normal. No trismus in the jaw. No uvula swelling. No oropharyngeal exudate, posterior oropharyngeal edema, posterior oropharyngeal erythema or tonsillar abscesses. No tonsillar exudate.  Eyes: Conjunctivae are normal. Right eye exhibits no discharge. Left eye exhibits no discharge.  Neck: Normal range of motion.  Cardiovascular: Normal rate and regular rhythm.  Pulmonary/Chest: Effort normal and breath sounds normal. No stridor. No respiratory distress. She has no wheezes. She has no rales.  Musculoskeletal: Normal range of motion. She exhibits no deformity.  Lymphadenopathy:    She has cervical adenopathy (.  Posterior cervical).  Neurological: She is alert and oriented to person, place, and time. She has normal reflexes.  Skin: Skin is warm and dry.  Psychiatric: She has a normal mood and affect. Her behavior is normal. Thought content normal.     UC Treatments / Results  Labs (all labs ordered are listed, but only abnormal results are displayed) Labs Reviewed  RAPID STREP SCREEN (NOT AT [facility])  RAPID INFLUENZA A&B ANTIGENS ([facility] ONLY)  CULTURE, GROUP A STREP [facility])    EKG  EKG Interpretation None       Radiology No results found.  Procedures Procedures (including critical care time)  Medications Ordered in UC Medications - No data to display   Initial Impression / Assessment and Plan / UC Course  I have reviewed the triage vital signs and the nursing notes.  Pertinent labs & imaging results that were available during my care of the patient were reviewed by me and considered in my medical decision making (see chart for details).     27 year old female with viral upper respiratory infection.  She is treated with Bromfed.  She will take  Tylenol and ibuprofen as needed for sore throat pain, body aches.  Flu swab and strep test were negative.  She is educated on signs and symptoms return to clinic for.  She will increase fluids.  Final Clinical Impressions(s) / UC Diagnoses   Final diagnoses:  Viral URI with cough    ED Discharge Orders        Ordered    brompheniramine-pseudoephedrine-DM 30-2-10 MG/5ML syrup  3 times daily PRN     12/17/17 1539        Evon SlackGaines, Thomas C, [place] JerseyPA-C 12/17/17 1542

## 2017-12-20 ENCOUNTER — Telehealth: Payer: Self-pay

## 2017-12-20 LAB — CULTURE, GROUP A STREP (THRC)

## 2017-12-20 NOTE — Telephone Encounter (Signed)
I called pt for f/u. She is doing much better and will f/u as needed

## 2018-03-15 ENCOUNTER — Ambulatory Visit: Admit: 2018-03-15 | Discharge: 2018-03-16 | Payer: MEDICAID

## 2018-03-15 DIAGNOSIS — B360 Pityriasis versicolor: Secondary | ICD-10-CM

## 2018-03-15 DIAGNOSIS — Z5181 Encounter for therapeutic drug level monitoring: Secondary | ICD-10-CM

## 2018-03-15 DIAGNOSIS — R21 Rash and other nonspecific skin eruption: Principal | ICD-10-CM

## 2018-03-15 MED ORDER — KETOCONAZOLE 2 % TOPICAL CREAM
2 refills | 0 days | Status: CP
Start: 2018-03-15 — End: ?

## 2018-03-21 ENCOUNTER — Other Ambulatory Visit: Payer: Self-pay

## 2018-03-21 ENCOUNTER — Emergency Department: Payer: Medicaid Other

## 2018-03-21 ENCOUNTER — Encounter: Payer: Self-pay | Admitting: Emergency Medicine

## 2018-03-21 ENCOUNTER — Emergency Department
Admission: EM | Admit: 2018-03-21 | Discharge: 2018-03-21 | Disposition: A | Discharge status: Home / Self Care | Payer: Medicaid Other | Attending: Emergency Medicine | Admitting: Emergency Medicine

## 2018-03-21 DIAGNOSIS — R112 Nausea with vomiting, unspecified: Secondary | ICD-10-CM | POA: Insufficient documentation

## 2018-03-21 DIAGNOSIS — Z79899 Other long term (current) drug therapy: Secondary | ICD-10-CM | POA: Insufficient documentation

## 2018-03-21 DIAGNOSIS — R1011 Right upper quadrant pain: Secondary | ICD-10-CM | POA: Diagnosis not present

## 2018-03-21 DIAGNOSIS — K8020 Calculus of gallbladder without cholecystitis without obstruction: Secondary | ICD-10-CM | POA: Diagnosis not present

## 2018-03-21 DIAGNOSIS — R109 Unspecified abdominal pain: Secondary | ICD-10-CM

## 2018-03-21 LAB — URINALYSIS, COMPLETE (UACMP) WITH MICROSCOPIC
Bacteria, UA: NONE SEEN
Bilirubin Urine: NEGATIVE
GLUCOSE, UA: NEGATIVE mg/dL
KETONES UR: NEGATIVE mg/dL
LEUKOCYTES UA: NEGATIVE
Nitrite: NEGATIVE
PROTEIN: NEGATIVE mg/dL
Specific Gravity, Urine: 1.023 (ref 1.005–1.030)
pH: 5 (ref 5.0–8.0)

## 2018-03-21 LAB — COMPREHENSIVE METABOLIC PANEL
ALBUMIN: 3.8 g/dL (ref 3.5–5.0)
ALT: 27 U/L (ref 14–54)
ANION GAP: 7 (ref 5–15)
AST: 24 U/L (ref 15–41)
Alkaline Phosphatase: 52 U/L (ref 38–126)
BILIRUBIN TOTAL: 0.4 mg/dL (ref 0.3–1.2)
BUN: 13 mg/dL (ref 6–20)
CHLORIDE: 107 mmol/L (ref 101–111)
CO2: 24 mmol/L (ref 22–32)
Calcium: 9.1 mg/dL (ref 8.9–10.3)
Creatinine, Ser: 0.57 mg/dL (ref 0.44–1.00)
GFR calc Af Amer: >60 mL/min (ref >60)
Glucose, Bld: 120 mg/dL — ABNORMAL HIGH (ref 65–99)
POTASSIUM: 4 mmol/L (ref 3.5–5.1)
Sodium: 138 mmol/L (ref 135–145)
TOTAL PROTEIN: 7.1 g/dL (ref 6.5–8.1)

## 2018-03-21 LAB — CBC
HEMATOCRIT: 39.2 % (ref 35.0–47.0)
HEMOGLOBIN: 13.3 g/dL (ref 12.0–16.0)
MCH: 28.3 pg (ref 26.0–34.0)
MCHC: 33.9 g/dL (ref 32.0–36.0)
MCV: 83.5 fL (ref 80.0–100.0)
Platelets: 179 10*3/uL (ref 150–440)
RBC: 4.7 MIL/uL (ref 3.80–5.20)
RDW: 13.7 % (ref 11.5–14.5)
WBC: 7 10*3/uL (ref 3.6–11.0)

## 2018-03-21 LAB — POCT PREGNANCY, URINE: Preg Test, Ur: NEGATIVE

## 2018-03-21 LAB — LIPASE, BLOOD: LIPASE: 25 U/L (ref 11–51)

## 2018-03-21 MED ORDER — ONDANSETRON 4 MG PO TBDP: 4.0000 mg | ORAL_TABLET | Freq: Three times a day (TID) | ORAL | 0 refills | Status: DC | PRN

## 2018-03-21 MED ORDER — ONDANSETRON HCL 4 MG/2ML IJ SOLN
4.0000 mg | Freq: Once | INTRAMUSCULAR | Status: AC
  Administered 2018-03-21: 4 mg via INTRAVENOUS
  Filled 2018-03-21: qty 2

## 2018-03-21 MED ORDER — KETOROLAC TROMETHAMINE 30 MG/ML IJ SOLN
30.0000 mg | Freq: Once | INTRAMUSCULAR | Status: AC
  Administered 2018-03-21: 30 mg via INTRAVENOUS
  Filled 2018-03-21: qty 1

## 2018-03-21 MED ORDER — MORPHINE SULFATE (PF) 4 MG/ML IV SOLN
4.0000 mg | Freq: Once | INTRAVENOUS | Status: AC
  Administered 2018-03-21: 4 mg via INTRAVENOUS
  Filled 2018-03-21: qty 1

## 2018-03-21 MED ORDER — SODIUM CHLORIDE 0.9 % IV BOLUS
1000.0000 mL | Freq: Once | INTRAVENOUS | Status: AC
  Administered 2018-03-21: 1000 mL via INTRAVENOUS

## 2018-03-21 MED ORDER — OXYCODONE-ACETAMINOPHEN 5-325 MG PO TABS: 2.0000 | ORAL_TABLET | ORAL | 0 refills | Status: DC | PRN

## 2018-03-21 NOTE — ED Notes (Signed)
Pt is AOX4, vital signs are stable, she c/o abdominal pain at 8/10, she denies chest pain, shortness of breath, N/V/D. Pt is in bed with rails upx2, family is at the bedside, we will continue to monitor.

## 2018-03-21 NOTE — ED Provider Notes (Signed)
[facility] Emergency Department Provider Note   ____________________________________________   First MD Initiated Contact with Patient 03/21/18 [phone]     (approximate)  I have reviewed the triage vital signs and the nursing notes.   HISTORY  Chief Complaint Abdominal Pain    HPI Carla Vincent is a 27 y.o. female who comes into the hospital today with some right upper quadrant pain.  The patient reports that she woke up with this pain.  She has had some pain in her lower abdomen and back before when she had kidney stones but it is higher today and does not feel as bad in her back.  The patient did have some denies any chest pains or shortness of breath.  Nausea with a little bit of vomiting.  She states that she took some ibuprofen for pain but it did not help.  The patient rates her pain a 10 out of 10 in intensity currently.  She denies any pain with urination or blood in her urine.  The patient she is here today for evaluation.   Past Medical History:  Diagnosis Date  . Renal disorder     Patient Active Problem List   Diagnosis Date Noted  . Labor and delivery, indication for care 06/28/2016  . Nausea & vomiting 05/09/2016  . Left lower quadrant abdominal tenderness 03/15/2016    History reviewed. No pertinent surgical history.  Prior to Admission medications   Medication Sig Start Date End Date Taking? Authorizing Provider  brompheniramine-pseudoephedrine-DM 30-2-10 MG/5ML syrup Take 5 mLs by mouth 3 (three) times daily as needed. 12/17/17   Evon Slack, PA-C  Fe Cbn-Fe Gluc-FA-B12-C-DSS (FERRALET 90) 90-1 MG TABS Take 1 tablet by mouth daily. 07/02/16   Nadara Mustard, MD  norethindrone (ORTHO MICRONOR) 0.35 MG tablet Take 1 tablet (0.35 mg total) by mouth daily. 07/02/16   Nadara Mustard, MD  ondansetron (ZOFRAN ODT) 4 MG disintegrating tablet Take 1 tablet (4 mg total) by mouth every 8 (eight) hours as needed for nausea or vomiting.  03/21/18   Rebecka Apley, MD  oxyCODONE-acetaminophen (PERCOCET/ROXICET) 5-325 MG tablet Take 2 tablets by mouth every 4 (four) hours as needed for severe pain. 03/21/18   Rebecka Apley, MD  Prenatal MV & Min w/FA-DHA (PRENATAL ADULT GUMMY/DHA/FA) 0.4-25 MG CHEW Chew 2 each by mouth every morning.    [provider]    Allergies Patient has no known allergies.  No family history on file.  Social History Social History   Tobacco Use  . Smoking status: Never Smoker  . Smokeless tobacco: Never Used  Substance Use Topics  . Alcohol use: Yes  . Drug use: Yes    Types: Marijuana    Comment: one time; about 4-5 months ago    Review of Systems  Constitutional: No fever/chills Eyes: No visual changes. ENT: No sore throat. Cardiovascular: Denies chest pain. Respiratory: Denies shortness of breath. Gastrointestinal: abdominal pain. nausea,  vomiting.  No diarrhea.  No constipation. Genitourinary: Negative for dysuria. Musculoskeletal: Negative for back pain. Skin: Negative for rash. Neurological: Negative for headaches   ____________________________________________   PHYSICAL EXAM:  VITAL SIGNS: ED Triage Vitals  Enc Vitals Group     BP 03/21/18 0448 (!) 144/90     Pulse Rate 03/21/18 0448 96     Resp 03/21/18 0448 18     Temp 03/21/18 0448 98.1 F (36.7 C)     Temp Source 03/21/18 0448 Oral  SpO2 03/21/18 0448 97 %     Weight 03/21/18 0440 165 lb (74.8 kg)     Height 03/21/18 0440  (1.575 m)     Head Circumference --      Peak Flow --      Pain Score 03/21/18 0440 10     Pain Loc --      Pain Edu? --      Excl. in GC? --     Constitutional: Alert and oriented. Well appearing and in moderate distress. Eyes: Conjunctivae are normal. PERRL. EOMI. Head: Atraumatic. Nose: No congestion/rhinnorhea. Mouth/Throat: Mucous membranes are moist.  Oropharynx non-erythematous. Cardiovascular: Normal rate, regular rhythm. Grossly normal heart sounds.   Good peripheral circulation. Respiratory: Normal respiratory effort.  No retractions. Lungs CTAB. Gastrointestinal: Soft with some right upper quadrant tenderness to palpation. No distention.  Positive bowel sounds Musculoskeletal: No lower extremity tenderness nor edema.   Neurologic:  Normal speech and language.  Skin:  Skin is warm, dry and intact.  Psychiatric: Mood and affect are normal.   ____________________________________________   LABS (all labs ordered are listed, but only abnormal results are displayed)  Labs Reviewed  URINALYSIS, COMPLETE (UACMP) WITH MICROSCOPIC - Abnormal; Notable for the following components:      Result Value   Color, Urine YELLOW (*)    APPearance CLEAR (*)    Hgb urine dipstick SMALL (*)    All other components within normal limits  COMPREHENSIVE METABOLIC PANEL - Abnormal; Notable for the following components:   Glucose, Bld 120 (*)    All other components within normal limits  LIPASE, BLOOD  CBC  POC URINE PREG, ED  POCT PREGNANCY, URINE   ____________________________________________  EKG  none ____________________________________________  RADIOLOGY  ED MD interpretation:  [place] Abd RUQ:   Official radiology report(s): US Abdomen Limited Ruq  Result Date: 03/21/2018 CLINICAL DATA:  Right upper quadrant pain radiating to the back for 4 hours. History of gallstones. EXAM: ULTRASOUND ABDOMEN LIMITED RIGHT UPPER QUADRANT COMPARISON:  MRI abdomen 05/30/2016 FINDINGS: Gallbladder: Multiple stones layering in the dependent portion of the gallbladder. Small stones in the neck. Largest stone measures about 7 mm. Layering sludge. No gallbladder wall thickening or edema. Murphy's sign is negative but the patient is medicated, which may limit the sensitivity of this finding. Common bile duct: Diameter: 2 mm, normal Liver: No focal lesion identified. Within normal limits in parenchymal echogenicity. Portal vein is patent on color Doppler imaging with  normal direction of blood flow towards the liver. IMPRESSION: Cholelithiasis and gallbladder sludge. No additional findings to suggest acute cholecystitis. No bile duct dilatation. Electronically Signed   By: Burman Nieves M.D.   On: 03/21/2018 06:47    ____________________________________________   PROCEDURES  Procedure(s) performed: None  Procedures  Critical Care performed: No  ____________________________________________   INITIAL IMPRESSION / ASSESSMENT AND PLAN / ED COURSE  As part of my medical decision making, I reviewed the following data within the electronic MEDICAL RECORD NUMBER Notes from prior ED visits and [place] Controlled Substance Database   This is a 27 year old female who comes into the hospital today with some right upper quadrant abdominal pain.  The patient has had kidney stones in the past but her pain is mainly in her abdomen.  My differential diagnosis includes pancreatitis, biliary colic, cholecystitis, kidney stone.  We did check some blood work on the patient and it was unremarkable.  The patient's white blood cell count is normal and the patient's CMP is negative.  The patient's lipase is 25 and her urinalysis is negative as well.  I ordered an ultrasound on the patient and I did give the patient a dose of morphine and Zofran.  The patient also received a liter of normal saline.  She will be reassessed once I receive the results of her ultrasound.      ____________________________________________   FINAL CLINICAL IMPRESSION(S) / ED DIAGNOSES  Final diagnoses:  Abdominal pain  Right upper quadrant abdominal pain  Calculus of gallbladder without cholecystitis without obstruction     ED Discharge Orders        Ordered    oxyCODONE-acetaminophen (PERCOCET/ROXICET) 5-325 MG tablet  Every 4 hours PRN     03/21/18 0841    ondansetron (ZOFRAN ODT) 4 MG disintegrating tablet  Every 8 hours PRN     03/21/18 0841       Note:  This document was  prepared using Dragon voice recognition software and may include unintentional dictation errors.    Rebecka Apley, MD 03/21/18 [phone]

## 2018-03-21 NOTE — ED Triage Notes (Signed)
Pt to triage via w/c, appears uncomfortable, grimacing; pt reports since 130am having right upper abd pain radiating into side & back accomp by nausea; st hx of kidney stones

## 2018-03-21 NOTE — Discharge Instructions (Signed)
Please follow-up with surgery for further treatment of your gallstones.  Please return with any worsening symptoms or any other concerns.

## 2018-03-25 DIAGNOSIS — K8020 Calculus of gallbladder without cholecystitis without obstruction: Secondary | ICD-10-CM

## 2018-03-25 HISTORY — DX: Calculus of gallbladder without cholecystitis without obstruction: K80.20

## 2018-03-27 ENCOUNTER — Encounter: Payer: Self-pay | Admitting: Surgery

## 2018-03-27 ENCOUNTER — Ambulatory Visit (INDEPENDENT_AMBULATORY_CARE_PROVIDER_SITE_OTHER): Payer: Medicaid Other | Admitting: Surgery

## 2018-03-27 VITALS — BP 137/86 | HR 87 | Temp 98.2°F | Ht 62.0 in | Wt 163.6 lb

## 2018-03-27 DIAGNOSIS — K8020 Calculus of gallbladder without cholecystitis without obstruction: Secondary | ICD-10-CM

## 2018-03-27 NOTE — Progress Notes (Signed)
Surgical Consultation  03/27/2018  Carla Vincent is an 27 y.o. female.   Chief Complaint  Patient presents with  . New Patient (Initial Visit)    Cholelithiasis     HPI: 27 year old female recently seen in the emergency room for right upper quadrant pain and nausea.  Patient reports that that pain was likely triggered by some heavy meal.  The pain was sharp moderate to severe intensity and radiated to the back.  No specific alleviating factors.  No fevers no chills no evidence of biliary obstruction.  LFTs are completely normal.  White count is normal as well as hemoglobin.  Ultrasound was performed and a half personally reviewed it there is evidence of cholelithiasis.  Normal common bile duct.  No evidence of cholecystitis. She has never had an operation before.  She is able to perform more than 6 METS without any shortness of breath or chest pain.  She does have a significant family history of gallbladder disease that have responded well to after cholecystectomy.   Past Medical History:  Diagnosis Date  . Left lower quadrant abdominal tenderness 03/15/2016  . Nausea & vomiting 05/09/2016  . Renal disorder     History reviewed. No pertinent surgical history.  Family History  Problem Relation Age of Onset  . Uterine cancer Maternal Aunt   . Breast cancer Paternal Aunt   . Diabetes Paternal Grandfather     Social History:  reports that she has never smoked. She has never used smokeless tobacco. She reports that she drinks alcohol. She reports that she has current or past drug history. Drug: Marijuana.  Allergies: No Known Allergies  Medications reviewed.     ROS Full ROS performed and is otherwise negative other than what is stated in the HPI    BP 137/86   Pulse 87   Temp 98.2 F (36.8 C) (Oral)   Ht 5\' 2"  (1.575 m)   Wt 74.2 kg (163 lb 9.6 oz)   BMI 29.92 kg/m   Physical Exam  Constitutional: She is oriented to person, place, and time. She appears  well-developed and well-nourished. No distress.  Eyes: EOM are normal. Right eye exhibits no discharge. Left eye exhibits no discharge. No scleral icterus.  Neck: Normal range of motion. Neck supple. No JVD present. No tracheal deviation present. No thyromegaly present.  Cardiovascular: Normal rate, regular rhythm and normal heart sounds. Exam reveals no gallop and no friction rub.  No murmur heard. Pulmonary/Chest: Effort normal and breath sounds normal. No stridor. No respiratory distress. She has no wheezes.  Abdominal: Soft. Bowel sounds are normal. She exhibits no distension and no mass. There is no tenderness. There is no rebound and no guarding. No hernia.  Musculoskeletal: Normal range of motion. She exhibits no edema.  Neurological: She is alert and oriented to person, place, and time. She displays normal reflexes. No cranial nerve deficit. She exhibits normal muscle tone. Coordination normal.  Skin: Skin is warm and dry. Capillary refill takes less than 2 seconds. She is not diaphoretic. No erythema.  Psychiatric: She has a normal mood and affect. Her behavior is normal. Judgment and thought content normal.    Assessment/Plan: 27 year old female with classic signs and symptoms consistent with symptomatic cholelithiasis.  Gust with the patient detail about her disease process.  I do  recommend cholecystectomy. The risks, benefits, complications, treatment options, and expected outcomes were discussed with the patient. The possibilities of bleeding, recurrent infection, finding a normal gallbladder, perforation of viscus organs, damage to  surrounding structures, bile leak, abscess formation, needing a drain placed, the need for additional procedures, reaction to medication, pulmonary aspiration,  failure to diagnose a condition, the possible need to convert to an open procedure, and creating a complication requiring transfusion or operation were discussed with the patient. The patient and/or  family concurred with the proposed plan, giving informed consent.  We will schedule for robotic assisted laparoscopic cholecystectomy possible open.  Extensive counseling provided  Sterling Big, MD [facility] General Surgeon

## 2018-03-27 NOTE — H&P (View-Only) (Signed)
Surgical Consultation  03/27/2018  Carla Vincent is an 27 y.o. female.   Chief Complaint  Patient presents with  . New Patient (Initial Visit)    Cholelithiasis     HPI: 27 year old female recently seen in the emergency room for right upper quadrant pain and nausea.  Patient reports that that pain was likely triggered by some heavy meal.  The pain was sharp moderate to severe intensity and radiated to the back.  No specific alleviating factors.  No fevers no chills no evidence of biliary obstruction.  LFTs are completely normal.  White count is normal as well as hemoglobin.  Ultrasound was performed and a half personally reviewed it there is evidence of cholelithiasis.  Normal common bile duct.  No evidence of cholecystitis. She has never had an operation before.  She is able to perform more than 6 METS without any shortness of breath or chest pain.  She does have a significant family history of gallbladder disease that have responded well to after cholecystectomy.   Past Medical History:  Diagnosis Date  . Left lower quadrant abdominal tenderness 03/15/2016  . Nausea & vomiting 05/09/2016  . Renal disorder     History reviewed. No pertinent surgical history.  Family History  Problem Relation Age of Onset  . Uterine cancer Maternal Aunt   . Breast cancer Paternal Aunt   . Diabetes Paternal Grandfather     Social History:  reports that she has never smoked. She has never used smokeless tobacco. She reports that she drinks alcohol. She reports that she has current or past drug history. Drug: Marijuana.  Allergies: No Known Allergies  Medications reviewed.     ROS Full ROS performed and is otherwise negative other than what is stated in the HPI    BP 137/86   Pulse 87   Temp 98.2 F (36.8 C) (Oral)   Ht 5\' 2"  (1.575 m)   Wt 74.2 kg (163 lb 9.6 oz)   BMI 29.92 kg/m   Physical Exam  Constitutional: She is oriented to person, place, and time. She appears  well-developed and well-nourished. No distress.  Eyes: EOM are normal. Right eye exhibits no discharge. Left eye exhibits no discharge. No scleral icterus.  Neck: Normal range of motion. Neck supple. No JVD present. No tracheal deviation present. No thyromegaly present.  Cardiovascular: Normal rate, regular rhythm and normal heart sounds. Exam reveals no gallop and no friction rub.  No murmur heard. Pulmonary/Chest: Effort normal and breath sounds normal. No stridor. No respiratory distress. She has no wheezes.  Abdominal: Soft. Bowel sounds are normal. She exhibits no distension and no mass. There is no tenderness. There is no rebound and no guarding. No hernia.  Musculoskeletal: Normal range of motion. She exhibits no edema.  Neurological: She is alert and oriented to person, place, and time. She displays normal reflexes. No cranial nerve deficit. She exhibits normal muscle tone. Coordination normal.  Skin: Skin is warm and dry. Capillary refill takes less than 2 seconds. She is not diaphoretic. No erythema.  Psychiatric: She has a normal mood and affect. Her behavior is normal. Judgment and thought content normal.    Assessment/Plan: 27 year old female with classic signs and symptoms consistent with symptomatic cholelithiasis.  Gust with the patient detail about her disease process.  I do  recommend cholecystectomy. The risks, benefits, complications, treatment options, and expected outcomes were discussed with the patient. The possibilities of bleeding, recurrent infection, finding a normal gallbladder, perforation of viscus organs, damage to  surrounding structures, bile leak, abscess formation, needing a drain placed, the need for additional procedures, reaction to medication, pulmonary aspiration,  failure to diagnose a condition, the possible need to convert to an open procedure, and creating a complication requiring transfusion or operation were discussed with the patient. The patient and/or  family concurred with the proposed plan, giving informed consent.  We will schedule for robotic assisted laparoscopic cholecystectomy possible open.  Extensive counseling provided  Sterling Big, MD [facility] General Surgeon

## 2018-03-27 NOTE — Patient Instructions (Addendum)
You have requested to have your gallbladder removed. This will be done  April 14, 2018 at [facility] with Dr. Sterling Bigiego Pabon.  You will most likely be out of work 1-2 weeks for this surgery. You will return after your post-op appointment with a lifting restriction for approximately 4 more weeks.  You will be able to eat anything you would like to following surgery. But, start by eating a bland diet and advance this as tolerated. The Gallbladder diet is below, please go as closely by this diet as possible prior to surgery to avoid any further attacks.  Please see the (blue)pre-care form that you have been given today. If you have any questions, please call our office.  Laparoscopic Cholecystectomy Laparoscopic cholecystectomy is surgery to remove the gallbladder. The gallbladder is located in the upper right part of the abdomen, behind the liver. It is a storage sac for bile, which is produced in the liver. Bile aids in the digestion and absorption of fats. Cholecystectomy is often done for inflammation of the gallbladder (cholecystitis). This condition is usually caused by a buildup of gallstones (cholelithiasis) in the gallbladder. Gallstones can block the flow of bile, and that can result in inflammation and pain. In severe cases, emergency surgery may be required. If emergency surgery is not required, you will have time to prepare for the procedure. Laparoscopic surgery is an alternative to open surgery. Laparoscopic surgery has a shorter recovery time. Your common bile duct may also need to be examined during the procedure. If stones are found in the common bile duct, they may be removed. LET [facility] KNOW ABOUT:  Any allergies you have.  All medicines you are taking, including vitamins, herbs, eye drops, creams, and over-the-counter medicines.  Previous problems you or members of your family have had with the use of anesthetics.  Any blood disorders you have.  Previous  surgeries you have had.    Any medical conditions you have. RISKS AND COMPLICATIONS Generally, this is a safe procedure. However, problems may occur, including:  Infection.  Bleeding.  Allergic reactions to medicines.  Damage to other structures or organs.  A stone remaining in the common bile duct.  A bile leak from the cyst duct that is clipped when your gallbladder is removed.  The need to convert to open surgery, which requires a larger incision in the abdomen. This may be necessary if your surgeon thinks that it is not safe to continue with a laparoscopic procedure. BEFORE THE PROCEDURE  Ask your health care provider about:  Changing or stopping your regular medicines. This is especially important if you are taking diabetes medicines or blood thinners.  Taking medicines such as aspirin and ibuprofen. These medicines can thin your blood. Do not take these medicines before your procedure if your health care provider instructs you not to.  Follow instructions from your health care provider about eating or drinking restrictions.  Let your health care provider know if you develop a cold or an infection before surgery.  Plan to have someone take you home after the procedure.  Ask your health care provider how your surgical site will be marked or identified.  You may be given antibiotic medicine to help prevent infection. PROCEDURE  To reduce your risk of infection:  Your health care team will wash or sanitize their hands.  Your skin will be washed with soap.  An IV tube may be inserted into one of your veins.  You will be given a  medicine to make you fall asleep (general anesthetic).  A breathing tube will be placed in your mouth.  The surgeon will make several small cuts (incisions) in your abdomen.  A thin, lighted tube (laparoscope) that has a tiny camera on the end will be inserted through one of the small incisions. The camera on the laparoscope will send a  picture to a TV screen (monitor) in the operating room. This will give the surgeon a good view inside your abdomen.  A gas will be pumped into your abdomen. This will expand your abdomen to give the surgeon more room to perform the surgery.  Other tools that are needed for the procedure will be inserted through the other incisions. The gallbladder will be removed through one of the incisions.  After your gallbladder has been removed, the incisions will be closed with stitches (sutures), staples, or skin glue.  Your incisions may be covered with a bandage (dressing). The procedure may vary among health care providers and hospitals. AFTER THE PROCEDURE  Your blood pressure, heart rate, breathing rate, and blood oxygen level will be monitored often until the medicines you were given have worn off.  You will be given medicines as needed to control your pain.   This information is not intended to replace advice given to you by your health care provider. Make sure you discuss any questions you have with your health care provider.   Document Released: 10/11/2005 Document Revised: 07/02/2015 Document Reviewed: 05/23/2013 Elsevier Interactive Patient Education 2016 [place] Diet for Gallbladder Conditions A low-fat diet can be helpful if you have pancreatitis or a gallbladder condition. With these conditions, your pancreas and gallbladder have trouble digesting fats. A healthy eating plan with less fat will help rest your pancreas and gallbladder and reduce your symptoms. WHAT DO I NEED TO KNOW ABOUT THIS DIET?  Eat a low-fat diet.  Reduce your fat intake to less than 20-30% of your total daily calories. This is less than 50-60 g of fat per day.  Remember that you need some fat in your diet. Ask your dietician what your daily goal should be.  Choose nonfat and low-fat healthy foods. Look for the words "nonfat," "low fat," or "fat free."  As a guide, look on the label and  choose foods with less than 3 g of fat per serving. Eat only one serving.  Avoid alcohol.  Do not smoke. If you need help quitting, talk with your health care provider.  Eat small frequent meals instead of three large heavy meals. WHAT FOODS CAN I EAT? Grains Include healthy grains and starches such as potatoes, wheat bread, fiber-rich cereal, and brown rice. Choose whole grain options whenever possible. In adults, whole grains should account for 45-65% of your daily calories.  Fruits and Vegetables Eat plenty of fruits and vegetables. Fresh fruits and vegetables add fiber to your diet. Meats and Other Protein Sources Eat lean meat such as chicken and pork. Trim any fat off of meat before cooking it. Eggs, fish, and beans are other sources of protein. In adults, these foods should account for 10-35% of your daily calories. Dairy Choose low-fat milk and dairy options. Dairy includes fat and protein, as well as calcium.  Fats and Oils Limit high-fat foods such as fried foods, sweets, baked goods, sugary drinks.  Other Creamy sauces and condiments, such as mayonnaise, can add extra fat. Think about whether or not you need to use them, or use smaller amounts or  low fat options. WHAT FOODS ARE NOT RECOMMENDED?  High fat foods, such as:  [employer].  Ice cream.  [place] toast.  Sweet rolls.  Pizza.  Cheese bread.  Foods covered with batter, butter, creamy sauces, or cheese.  Fried foods.  Sugary drinks and desserts.  Foods that cause gas or bloating   This information is not intended to replace advice given to you by your health care provider. Make sure you discuss any questions you have with your health care provider.   Document Released: 10/16/2013 Document Reviewed: 10/16/2013 Elsevier Interactive Patient Education [employer].

## 2018-03-29 ENCOUNTER — Telehealth: Payer: Self-pay | Admitting: Surgery

## 2018-03-29 MED ORDER — APREMILAST 30 MG TABLET
ORAL_TABLET | Freq: Two times a day (BID) | ORAL | 2 refills | 0.00000 days | Status: CP
Start: 2018-03-29 — End: 2018-03-29

## 2018-03-29 MED ORDER — APREMILAST 10 MG (4)-20 MG (4)-30 MG (47) TABLETS IN A DOSE PACK
ORAL_TABLET | ORAL | 0 refills | 0.00000 days | Status: CP
Start: 2018-03-29 — End: 2018-03-29

## 2018-03-29 MED ORDER — APREMILAST 30 MG TABLET: 30 mg | tablet | 2 refills | 0 days

## 2018-03-29 MED ORDER — APREMILAST 10 MG (4)-20 MG (4)-30 MG (47) TABLETS IN A DOSE PACK: tablet | 0 refills | 0 days

## 2018-03-29 NOTE — Unmapped (Signed)
Per test claim for [place] at the [facility], patient needs Medication Assistance Program for Prior Authorization.

## 2018-03-29 NOTE — Telephone Encounter (Signed)
Pt advised of pre op date/time and sx date. Sx: 04/14/18 with Dr Andee LinemanPabon-robot assisted laparoscopic cholecystectomy.  Pre op: 04/07/18 between 1-5:00pm-phone interview.   Patient made aware to call [phone], between 1-3:00pm the day before surgery, to find out what time to arrive.

## 2018-04-04 ENCOUNTER — Telehealth: Payer: Self-pay | Admitting: Surgery

## 2018-04-04 NOTE — Unmapped (Signed)
Addended by: Baltazar Apo on: 04/03/2018 05:38 PM     Modules accepted: Orders

## 2018-04-04 NOTE — Unmapped (Signed)
I spoke with Ms. Kaitlyn Carson this evening.  Informed her that Kaitlyn Carson denied, needs to try MTX first.  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  She is in agreement to try.  Discussed need for baseline labs, I have ordered at [facility]. She will go at her convenience.  OF note, she is having gallbladder surgery next week. I advised she discuss MTX with her surgeon as we defer starting this med to her surgeon. She will contact us with his decision.

## 2018-04-04 NOTE — Telephone Encounter (Signed)
Called patient letting her know that she should not start her Methatrexate until after she is seen post op. Patient understood and had no further questions.

## 2018-04-04 NOTE — Telephone Encounter (Signed)
Would like to know if she can start methatextrate as recently prescribed by her dermatologist before she has surgery - please advise

## 2018-04-07 ENCOUNTER — Encounter
Admission: RE | Admit: 2018-04-07 | Discharge: 2018-04-07 | Disposition: A | Discharge status: Home / Self Care | Payer: Medicaid Other | Source: Ambulatory Visit | Attending: Surgery | Admitting: Surgery

## 2018-04-07 ENCOUNTER — Other Ambulatory Visit: Payer: Self-pay

## 2018-04-07 HISTORY — DX: Calculus of gallbladder without cholecystitis without obstruction: K80.20

## 2018-04-07 HISTORY — DX: Personal history of urinary calculi: Z87.442

## 2018-04-07 NOTE — Patient Instructions (Signed)
Your procedure is scheduled on: Friday 6/21 Report to Day Surgery. To find out your arrival time please call [phone] between 1PM - 3PM on Thurs 6/20  Remember: Instructions that are not followed completely may result in serious medical risk, up to and including death, or upon the discretion of your surgeon and anesthesiologist your surgery may need to be rescheduled.     _X__ 1. Do not eat food after midnight the night before your procedure.                 No gum chewing or hard candies. You may drink clear liquids up to 2 hours                 before you are scheduled to arrive for your surgery- DO not drink clear                 liquids within 2 hours of the start of your surgery.                 Clear Liquids include:  water, apple juice without pulp, clear carbohydrate                 drink such as Clearfast of Gartorade, Black Coffee or Tea (Do not add                 anything to coffee or tea).  __X__2.  On the morning of surgery brush your teeth with toothpaste and water, you  may rinse your mouth with mouthwash if you wish.  Do not swallow any  toothpaste of mouthwash.     _X__ 3.  No Alcohol for 24 hours before or after surgery.   ___ 4.  Do Not Smoke or use e-cigarettes For 24 Hours Prior to Your Surgery.                 Do not use any chewable tobacco products for at least 6 hours prior to                 surgery.  ____  5.  Bring all medications with you on the day of surgery if instructed.   __x__  6.  Notify your doctor if there is any change in your medical condition      (cold, fever, infections).     Do not wear jewelry, make-up, hairpins, clips or nail polish. Do not wear lotions, powders, or perfumes. You may wear deodorant. Do not shave 48 hours prior to surgery. Men may shave face and neck. Do not bring valuables to the hospital.    [facility] is not responsible for any belongings or valuables.  Contacts, dentures or bridgework  may not be worn into surgery. Leave your suitcase in the car. After surgery it may be brought to your room. For patients admitted to the hospital, discharge time is determined by your treatment team.   Patients discharged the day of surgery will not be allowed to drive home.   Please read over the following fact sheets that you were given:    __x__ Take these medicines the morning of surgery with A SIP OF WATER:    1. none  2.   3.   4.  5.  6.  ____ Fleet Enema (as directed)   ____ Use CHG Soap as directed  ____ Use inhalers on the day of surgery  ____ Stop metformin 2 days prior to surgery    ____ Take  1/2 of usual insulin dose the night before surgery. No insulin the morning          of surgery.   ____ Stop Coumadin/Plavix/aspirin on   ____ Stop Anti-inflammatories on    ____ Stop supplements until after surgery.    ____ Bring C-Pap to the hospital.

## 2018-04-13 ENCOUNTER — Encounter: Payer: Self-pay | Admitting: Anesthesiology

## 2018-04-14 ENCOUNTER — Ambulatory Visit
Admission: RE | Admit: 2018-04-14 | Discharge: 2018-04-14 | Disposition: A | Discharge status: Home / Self Care | Payer: Medicaid Other | Source: Ambulatory Visit | Attending: Surgery | Admitting: Surgery

## 2018-04-14 ENCOUNTER — Other Ambulatory Visit: Payer: Self-pay

## 2018-04-14 ENCOUNTER — Encounter
Admission: RE | Disposition: A | Discharge status: Home / Self Care | Payer: Self-pay | Source: Ambulatory Visit | Attending: Surgery

## 2018-04-14 ENCOUNTER — Encounter: Payer: Self-pay | Admitting: *Deleted

## 2018-04-14 ENCOUNTER — Ambulatory Visit: Payer: Medicaid Other | Admitting: Anesthesiology

## 2018-04-14 DIAGNOSIS — Z87442 Personal history of urinary calculi: Secondary | ICD-10-CM | POA: Diagnosis not present

## 2018-04-14 DIAGNOSIS — K811 Chronic cholecystitis: Secondary | ICD-10-CM

## 2018-04-14 DIAGNOSIS — K8010 Calculus of gallbladder with chronic cholecystitis without obstruction: Secondary | ICD-10-CM | POA: Diagnosis not present

## 2018-04-14 HISTORY — PX: ROBOTIC ASSISTED LAPAROSCOPIC CHOLECYSTECTOMY-MULTI SITE: SHX6603

## 2018-04-14 LAB — QUANTIFERON TB GOLD
QUANTIFERON MITOGEN VALUE: >10 [IU]/mL
QUANTIFERON NIL VALUE: 0.03 [IU]/mL
QUANTIFERON TB GOLD: NEGATIVE

## 2018-04-14 LAB — ALT (SGPT): Lab: 30

## 2018-04-14 LAB — IMMATURE GRANULOCYTES: Lab: 1

## 2018-04-14 LAB — CBC W/ DIFFERENTIAL
BANDED NEUTROPHILS ABSOLUTE COUNT: 0 10*3/uL (ref 0.0–0.1)
BASOPHILS ABSOLUTE COUNT: 0 10*3/uL (ref 0.0–0.2)
BASOPHILS RELATIVE PERCENT: 1 %
EOSINOPHILS ABSOLUTE COUNT: 0.1 10*3/uL (ref 0.0–0.4)
EOSINOPHILS RELATIVE PERCENT: 1 %
HEMATOCRIT: 40 % (ref 34.0–46.6)
HEMOGLOBIN: 13.6 g/dL (ref 11.1–15.9)
IMMATURE GRANULOCYTES: 1 %
LYMPHOCYTES RELATIVE PERCENT: 28 %
MEAN CORPUSCULAR HEMOGLOBIN CONC: 34 g/dL (ref 31.5–35.7)
MEAN CORPUSCULAR HEMOGLOBIN: 27.3 pg (ref 26.6–33.0)
MEAN CORPUSCULAR VOLUME: 80 fL (ref 79–97)
MONOCYTES ABSOLUTE COUNT: 0.4 10*3/uL (ref 0.1–0.9)
MONOCYTES RELATIVE PERCENT: 5 %
NEUTROPHILS RELATIVE PERCENT: 64 %
PLATELET COUNT: 248 10*3/uL (ref 150–450)
RED BLOOD CELL COUNT: 4.98 x10E6/uL (ref 3.77–5.28)
RED CELL DISTRIBUTION WIDTH: 13.7 % (ref 12.3–15.4)
WHITE BLOOD CELL COUNT: 8.6 10*3/uL (ref 3.4–10.8)

## 2018-04-14 LAB — CREATININE
GFR MDRD AF AMER: 145 mL/min/{1.73_m2}
GFR MDRD NON AF AMER: 126 mL/min/{1.73_m2}

## 2018-04-14 LAB — HEP B SURFACE AB, QUAL: Lab: NONREACTIVE

## 2018-04-14 LAB — HEP C VIRUS AB: Lab: <0.1

## 2018-04-14 LAB — GFR MDRD AF AMER: Lab: 145

## 2018-04-14 LAB — HEPATITIS B CORE TOTAL ANTIBODY: Lab: NEGATIVE

## 2018-04-14 LAB — QUANTIFERON MITOGEN VALUE: Lab: >10

## 2018-04-14 LAB — BLOOD UREA NITROGEN: Lab: 10

## 2018-04-14 LAB — HEPATITIS B SURFACE ANTIGEN: Lab: NEGATIVE

## 2018-04-14 LAB — AST (SGOT): Lab: 16

## 2018-04-14 LAB — URINE DRUG SCREEN, QUALITATIVE (ARMC ONLY)
AMPHETAMINES, UR SCREEN: NOT DETECTED
Benzodiazepine, Ur Scrn: NOT DETECTED
COCAINE METABOLITE, UR ~~LOC~~: NOT DETECTED
Cannabinoid 50 Ng, Ur ~~LOC~~: NOT DETECTED
MDMA (Ecstasy)Ur Screen: NOT DETECTED
METHADONE SCREEN, URINE: NOT DETECTED
OPIATE, UR SCREEN: NOT DETECTED
Phencyclidine (PCP) Ur S: NOT DETECTED
Tricyclic, Ur Screen: NOT DETECTED

## 2018-04-14 SURGERY — ROBOTIC ASSISTED LAPAROSCOPIC CHOLECYSTECTOMY-MULTI SITE
Anesthesia: General | Site: Abdomen | Wound class: "Clean Contaminated "

## 2018-04-14 MED ORDER — PROPOFOL 10 MG/ML IV BOLUS
INTRAVENOUS | Status: DC | PRN
  Administered 2018-04-14: 200 mg via INTRAVENOUS

## 2018-04-14 MED ORDER — FAMOTIDINE 20 MG PO TABS
ORAL_TABLET | ORAL | Status: AC
  Administered 2018-04-14: 20 mg via ORAL
  Filled 2018-04-14: qty 1

## 2018-04-14 MED ORDER — PROPOFOL 10 MG/ML IV BOLUS
INTRAVENOUS | Status: AC
  Filled 2018-04-14: qty 20

## 2018-04-14 MED ORDER — FENTANYL CITRATE (PF) 100 MCG/2ML IJ SOLN
INTRAMUSCULAR | Status: AC
  Filled 2018-04-14: qty 2

## 2018-04-14 MED ORDER — OXYCODONE HCL 5 MG PO TABS
5.0000 mg | ORAL_TABLET | Freq: Once | ORAL | Status: AC | PRN
  Administered 2018-04-14: 5 mg via ORAL

## 2018-04-14 MED ORDER — CEFAZOLIN SODIUM-DEXTROSE 2-4 GM/100ML-% IV SOLN
INTRAVENOUS | Status: AC
  Filled 2018-04-14: qty 100

## 2018-04-14 MED ORDER — CHLORHEXIDINE GLUCONATE CLOTH 2 % EX PADS: 6.0000 | MEDICATED_PAD | Freq: Once | CUTANEOUS | Status: DC

## 2018-04-14 MED ORDER — FENTANYL CITRATE (PF) 100 MCG/2ML IJ SOLN
INTRAMUSCULAR | Status: AC
  Administered 2018-04-14: 25 ug via INTRAVENOUS
  Filled 2018-04-14: qty 2

## 2018-04-14 MED ORDER — OXYCODONE HCL 5 MG/5ML PO SOLN: 5.0000 mg | Freq: Once | ORAL | Status: AC | PRN

## 2018-04-14 MED ORDER — BUPIVACAINE HCL 0.25 % IJ SOLN
INTRAMUSCULAR | Status: DC | PRN
  Administered 2018-04-14: 30 mL

## 2018-04-14 MED ORDER — MIDAZOLAM HCL 2 MG/2ML IJ SOLN
INTRAMUSCULAR | Status: DC | PRN
  Administered 2018-04-14: 2 mg via INTRAVENOUS

## 2018-04-14 MED ORDER — LACTATED RINGERS IV SOLN
INTRAVENOUS | Status: DC
  Administered 2018-04-14 (×2): via INTRAVENOUS

## 2018-04-14 MED ORDER — FENTANYL CITRATE (PF) 100 MCG/2ML IJ SOLN
INTRAMUSCULAR | Status: DC | PRN
  Administered 2018-04-14 (×4): 50 ug via INTRAVENOUS

## 2018-04-14 MED ORDER — FENTANYL CITRATE (PF) 100 MCG/2ML IJ SOLN
25.0000 ug | INTRAMUSCULAR | Status: AC | PRN
  Administered 2018-04-14 (×6): 25 ug via INTRAVENOUS

## 2018-04-14 MED ORDER — CEFAZOLIN SODIUM-DEXTROSE 2-4 GM/100ML-% IV SOLN
2.0000 g | INTRAVENOUS | Status: AC
  Administered 2018-04-14: 2 g via INTRAVENOUS

## 2018-04-14 MED ORDER — ONDANSETRON HCL 4 MG/2ML IJ SOLN
INTRAMUSCULAR | Status: DC | PRN
  Administered 2018-04-14: 4 mg via INTRAVENOUS

## 2018-04-14 MED ORDER — INDOCYANINE GREEN 25 MG IV SOLR
2.5000 mg | Freq: Once | INTRAVENOUS | Status: AC
  Administered 2018-04-14: 2.5 mg via INTRAVENOUS
  Filled 2018-04-14: qty 25

## 2018-04-14 MED ORDER — MIDAZOLAM HCL 2 MG/2ML IJ SOLN
INTRAMUSCULAR | Status: AC
  Filled 2018-04-14: qty 2

## 2018-04-14 MED ORDER — INDOCYANINE GREEN 25 MG IV SOLR
INTRAVENOUS | Status: AC
  Filled 2018-04-14: qty 25

## 2018-04-14 MED ORDER — LIDOCAINE HCL (CARDIAC) PF 100 MG/5ML IV SOSY
PREFILLED_SYRINGE | INTRAVENOUS | Status: DC | PRN
  Administered 2018-04-14: 100 mg via INTRAVENOUS

## 2018-04-14 MED ORDER — BUPIVACAINE HCL (PF) 0.25 % IJ SOLN
INTRAMUSCULAR | Status: AC
  Filled 2018-04-14: qty 30

## 2018-04-14 MED ORDER — ROCURONIUM BROMIDE 100 MG/10ML IV SOLN
INTRAVENOUS | Status: DC | PRN
  Administered 2018-04-14: 50 mg via INTRAVENOUS

## 2018-04-14 MED ORDER — HYDROCODONE-ACETAMINOPHEN 5-325 MG PO TABS
ORAL_TABLET | ORAL | Status: AC
  Filled 2018-04-14: qty 1

## 2018-04-14 MED ORDER — FAMOTIDINE 20 MG PO TABS
20.0000 mg | ORAL_TABLET | Freq: Once | ORAL | Status: AC
  Administered 2018-04-14: 20 mg via ORAL

## 2018-04-14 MED ORDER — HYDROCODONE-ACETAMINOPHEN 5-325 MG PO TABS: 1.0000 | ORAL_TABLET | ORAL | 0 refills | Status: DC | PRN

## 2018-04-14 MED ORDER — DEXAMETHASONE SODIUM PHOSPHATE 10 MG/ML IJ SOLN
INTRAMUSCULAR | Status: DC | PRN
  Administered 2018-04-14: 10 mg via INTRAVENOUS

## 2018-04-14 MED ORDER — OXYCODONE HCL 5 MG PO TABS
ORAL_TABLET | ORAL | Status: AC
  Administered 2018-04-14: 5 mg via ORAL
  Filled 2018-04-14: qty 1

## 2018-04-14 MED ORDER — ONDANSETRON HCL 4 MG/2ML IJ SOLN
INTRAMUSCULAR | Status: AC
  Administered 2018-04-14: 4 mg
  Filled 2018-04-14: qty 2

## 2018-04-14 SURGICAL SUPPLY — 46 items
"PENCIL ELECTRO HAND CTR " (MISCELLANEOUS) ×1 IMPLANT
CANISTER SUCT 1200ML W/VALVE (MISCELLANEOUS) ×3 IMPLANT
CHLORAPREP W/TINT 26ML (MISCELLANEOUS) ×3 IMPLANT
CLIP VESOLOCK MED LG 6/CT (CLIP) ×10 IMPLANT
CORD BIP STRL DISP 12FT (MISCELLANEOUS) ×3 IMPLANT
COVER TIP SHEARS 8 DVNC (MISCELLANEOUS) IMPLANT
COVER TIP SHEARS 8MM DA VINCI (MISCELLANEOUS)
DECANTER SPIKE VIAL GLASS SM (MISCELLANEOUS) ×3 IMPLANT
DEFOGGER SCOPE WARMER CLEARIFY (MISCELLANEOUS) ×3 IMPLANT
DERMABOND ADVANCED (GAUZE/BANDAGES/DRESSINGS) ×2
DERMABOND ADVANCED .7 DNX12 (GAUZE/BANDAGES/DRESSINGS) ×1 IMPLANT
DRAPE 3 ARM ACCESS DA VINCI (DRAPES) ×2
DRAPE 3 ARM ACCESS DVNC (DRAPES) ×1 IMPLANT
DRAPE SHEET LG 3/4 BI-LAMINATE (DRAPES) ×9 IMPLANT
DRSG TEGADERM 2-3/8X2-3/4 SM (GAUZE/BANDAGES/DRESSINGS) ×12 IMPLANT
DRSG TELFA 3X8 NADH (GAUZE/BANDAGES/DRESSINGS) ×3 IMPLANT
ELECT REM PT RETURN 9FT ADLT (ELECTROSURGICAL) ×3
ELECTRODE REM PT RTRN 9FT ADLT (ELECTROSURGICAL) ×1 IMPLANT
GLOVE BIO SURGEON STRL SZ7 (GLOVE) ×12 IMPLANT
GLOVE INDICATOR 7.5 STRL GRN (GLOVE) ×6 IMPLANT
GOWN STRL REUS W/ TWL LRG LVL3 (GOWN DISPOSABLE) ×4 IMPLANT
GOWN STRL REUS W/TWL LRG LVL3 (GOWN DISPOSABLE) ×8
IRRIGATION STRYKERFLOW (MISCELLANEOUS) IMPLANT
IRRIGATOR STRYKERFLOW (MISCELLANEOUS) ×3
IV NS 1000ML (IV SOLUTION) ×2
IV NS 1000ML BAXH (IV SOLUTION) ×1 IMPLANT
KIT PINK PAD W/HEAD ARE REST (MISCELLANEOUS) ×3
KIT PINK PAD W/HEAD ARM REST (MISCELLANEOUS) ×1 IMPLANT
LABEL OR SOLS (LABEL) ×3 IMPLANT
NEEDLE HYPO 22GX1.5 SAFETY (NEEDLE) ×3 IMPLANT
NS IRRIG 500ML POUR BTL (IV SOLUTION) ×3 IMPLANT
PACK LAP CHOLECYSTECTOMY (MISCELLANEOUS) ×3 IMPLANT
PAD DRESSING TELFA 3X8 NADH (GAUZE/BANDAGES/DRESSINGS) ×1 IMPLANT
PENCIL ELECTRO HAND CTR (MISCELLANEOUS) ×3 IMPLANT
POUCH SPECIMEN RETRIEVAL 10MM (ENDOMECHANICALS) ×3 IMPLANT
PROGRASP ENDOWRIST DA VINCI (INSTRUMENTS)
PROGRASP ENDOWRIST DVNC (INSTRUMENTS) IMPLANT
SOLUTION ELECTROLUBE (MISCELLANEOUS) ×3 IMPLANT
SPONGE LAP 18X18 5 PK (GAUZE/BANDAGES/DRESSINGS) ×3 IMPLANT
STRAP SAFETY 5IN WIDE (MISCELLANEOUS) ×3 IMPLANT
SUT ETHIBOND 0 MO6 C/R (SUTURE) ×3 IMPLANT
SUT MNCRL AB 4-0 PS2 18 (SUTURE) ×3 IMPLANT
SUT VICRYL 0 AB UR-6 (SUTURE) ×3 IMPLANT
TROCAR 130MM GELPORT  DAV (MISCELLANEOUS) ×3 IMPLANT
TROCAR XCEL NON-BLD 5MMX100MML (ENDOMECHANICALS) ×3 IMPLANT
TUBING INSUF HEATED (TUBING) ×3 IMPLANT

## 2018-04-14 NOTE — Anesthesia Procedure Notes (Signed)
Procedure Name: Intubation Date/Time: 04/14/2018 7:46 AM Performed by: Philbert Riser, CRNA Pre-anesthesia Checklist: Patient identified, Emergency Drugs available, Suction available, Patient being monitored and Timeout performed Patient Re-evaluated:Patient Re-evaluated prior to induction Oxygen Delivery Method: Simple face mask and Circle system utilized Preoxygenation: Pre-oxygenation with 100% oxygen Induction Type: IV induction Ventilation: Mask ventilation without difficulty Laryngoscope Size: Mac and 3 Grade View: Grade I Tube type: Oral Tube size: 7.0 mm Number of attempts: 1 Airway Equipment and Method: Stylet Placement Confirmation: ETT inserted through vocal cords under direct vision and positive ETCO2 Secured at: 21 cm Tube secured with: Tape

## 2018-04-14 NOTE — Discharge Instructions (Addendum)
Laparoscopic Cholecystectomy, Care After  ° °These instructions give you information on caring for yourself after your procedure. Your doctor may also give you more specific instructions. Call your doctor if you have any problems or questions after your procedure.  °HOME CARE  °Change your bandages (dressings) as told by your doctor.  °Keep the wound dry and clean. Wash the wound gently with soap and water. Pat the wound dry with a clean towel.  °Do not take baths, swim, or use hot tubs for 2 weeks, or as told by your doctor.  °Only take medicine as told by your doctor.  °Eat a normal diet as told by your doctor.  °Do not lift anything heavier than 10 pounds (4.5 kg) until your doctor says it is okay.  °Do not play contact sports for 1 week, or as told by your doctor. °GET HELP IF:  °Your wound is red, puffy (swollen), or painful.  °You have yellowish-white fluid (pus) coming from the wound.  °You have fluid draining from the wound for more than 1 day.  °You have a bad smell coming from the wound.  °Your wound breaks open. °GET HELP RIGHT AWAY IF:  °You have trouble breathing.  °You have chest pain.  °You have a fever >101  °You have pain in the shoulders (shoulder strap areas) that is getting worse.  °You feel dizzy or pass out (faint).  °You have severe belly (abdominal) pain.  °You feel sick to your stomach (nauseous) or throw up (vomit) for more than 1 day. ° ° ° °AMBULATORY SURGERY  °DISCHARGE INSTRUCTIONS ° ° °1) The drugs that you were given will stay in your system until tomorrow so for the next 24 hours you should not: ° °A) Drive an automobile °B) Make any legal decisions °C) Drink any alcoholic beverage ° ° °2) You may resume regular meals tomorrow.  Today it is better to start with liquids and gradually work up to solid foods. ° °You may eat anything you prefer, but it is better to start with liquids, then soup and crackers, and gradually work up to solid foods. ° ° °3) Please notify your doctor  immediately if you have any unusual bleeding, trouble breathing, redness and pain at the surgery site, drainage, fever, or pain not relieved by medication. ° ° ° °4) Additional Instructions: ° ° ° ° ° ° ° °Please contact your physician with any problems or Same Day Surgery at [phone], Monday through Friday 6 am to 4 pm, or [place] [facility] [place] [facility] number at [phone]. ° ° °

## 2018-04-14 NOTE — Anesthesia Preprocedure Evaluation (Signed)
Anesthesia Evaluation  Patient identified by MRN, date of birth, ID band Patient awake    Reviewed: Allergy & Precautions, H&P , NPO status , Patient's Chart, lab work & pertinent test results  History of Anesthesia Complications Negative for: history of anesthetic complications  Airway Mallampati: III  TM Distance: >3 FB Neck ROM: full    Dental  (+) Chipped   Pulmonary neg pulmonary ROS,           Cardiovascular Exercise Tolerance: Good (-) angina(-) Past MI and (-) DOE negative cardio ROS       Neuro/Psych negative neurological ROS  negative psych ROS   GI/Hepatic negative GI ROS, Neg liver ROS, neg GERD  ,  Endo/Other  negative endocrine ROS  Renal/GU      Musculoskeletal   Abdominal   Peds  Hematology negative hematology ROS (+)   Anesthesia Other Findings Past Medical History: 03/2018: Gallstones No date: History of kidney stones 03/15/2016: Left lower quadrant abdominal tenderness 05/09/2016: Nausea & vomiting  History reviewed. No pertinent surgical history.  BMI    Body Mass Index:  29.55 kg/m      Reproductive/Obstetrics negative OB ROS                             Anesthesia Physical Anesthesia Plan  ASA: II  Anesthesia Plan: General ETT   Post-op Pain Management:    Induction: Intravenous  PONV Risk Score and Plan: Ondansetron, Dexamethasone, Midazolam and Treatment may vary due to age or medical condition  Airway Management Planned: Oral ETT  Additional Equipment:   Intra-op Plan:   Post-operative Plan: Extubation in OR  Informed Consent: I have reviewed the patients History and Physical, chart, labs and discussed the procedure including the risks, benefits and alternatives for the proposed anesthesia with the patient or authorized representative who has indicated his/her understanding and acceptance.   Dental Advisory Given  Plan Discussed with:  Anesthesiologist, CRNA and Surgeon  Anesthesia Plan Comments: (Patient consented for risks of anesthesia including but not limited to:  - adverse reactions to medications - damage to teeth, lips or other oral mucosa - sore throat or hoarseness - Damage to heart, brain, lungs or loss of life  Patient voiced understanding.)        Anesthesia Quick Evaluation

## 2018-04-14 NOTE — Anesthesia Post-op Follow-up Note (Signed)
Anesthesia QCDR form completed.        

## 2018-04-14 NOTE — OR Nursing (Signed)
Discussed discharge instructions with pt and husband. Both voice understanding. 

## 2018-04-14 NOTE — Op Note (Signed)
Robotic assisted laparoscopic Cholecystectomy  Pre-operative Diagnosis: Chronic cholecystitis  Post-operative Diagnosis: Same  Procedure: Biotic assisted laparoscopic cholecystectomy  Surgeon: Sterling Big, MD FACS  Anesthesia: Gen. with endotracheal tube   Findings: Chronic cholecystitis  No evidence of bile leak with ICG cholangiography.  Estimated Blood Loss: 10cc                 Specimens: Gallbladder           Complications: none   Procedure Details  The patient was seen again in the Holding Room. The benefits, complications, treatment options, and expected outcomes were discussed with the patient. The risks of bleeding, infection, recurrence of symptoms, failure to resolve symptoms, bile duct damage, bile duct leak, retained common bile duct stone, bowel injury, any of which could require further surgery and/or ERCP, stent, or papillotomy were reviewed with the patient. The likelihood of improving the patient's symptoms with return to their baseline status is good.  The patient and/or family concurred with the proposed plan, giving informed consent.  The patient was taken to Operating Room, identified as Carla Vincent and the procedure verified as Laparoscopic Cholecystectomy.  A Time Out was held and the above information confirmed.  Prior to the induction of general anesthesia, antibiotic prophylaxis was administered. VTE prophylaxis was in place. General endotracheal anesthesia was then administered and tolerated well. After the induction, the abdomen was prepped with Chloraprep and draped in the sterile fashion. The patient was positioned in the supine position.  Cut down technique was used to enter the abdominal cavity and a Hasson trochar was placed after two vicryl stitches were anchored to the fascia. Pneumoperitoneum was then created with CO2 and tolerated well without any adverse changes in the patient's vital signs.  Three ports were placed in the  upper quadrants all  under direct vision. All skin incisions  were infiltrated with a local anesthetic agent before making the incision and placing the trocars.   The patient was positioned  in reverse Trendelenburg, tilted slightly to the patient's left.  The gallbladder was identified, the fundus grasped and retracted cephalad.  Carla Vincent was brought to the surgical field and the arms were docked in the standard fashion.  We maintained constant direct visualization of robotic instruments at all time and maintain proper alignment of the robotic arms to avoid any collisions.  I scrubbed out and went to the consult and start the robotic part.  Adhesions were lysed bluntly. The infundibulum was grasped and retracted laterally, exposing the peritoneum overlying the triangle of Calot. This was then divided and exposed in a blunt fashion. An extended critical view of the cystic duct and cystic artery was obtained.  The cystic duct was clearly identified and bluntly dissected.   Artery and duct were double clipped and divided.  The gallbladder was taken from the gallbladder fossa in a retrograde fashion with the electrocautery. ICG demonstrating no evidence of bile leaks or aberrant ducts. After confirming adequate hemostasis, all the robotic instruments were removed and the robot was undocked.   The gallbladder was removed and placed in an Endocatch bag. The liver bed was irrigated and inspected.  Copious irrigation was utilized and was repeatedly aspirated until clear.  The gallbladder and Endocatch sac were then removed through a port site.   Inspection of the right upper quadrant was performed. No bleeding, bile duct injury or leak, or bowel injury was noted. Pneumoperitoneum was released.  The periumbilical port site was closed with interrumpted 0 Vicryl sutures.  4-0 subcuticular Monocryl was used to close the skin. Dermabond was  applied.  The patient was then extubated and brought to the recovery room in stable condition.  Sponge, lap, and needle counts were correct at closure and at the conclusion of the case.               Sterling Bigiego Carla Hershberger, MD, FACS

## 2018-04-14 NOTE — Interval H&P Note (Signed)
History and Physical Interval Note:  04/14/2018 7:21 AM  Carla Vincent  has presented today for surgery, with the diagnosis of CALCULUS OF GALLBLADDER  The various methods of treatment have been discussed with the patient and family. After consideration of risks, benefits and other options for treatment, the patient has consented to  Procedure(s): ROBOTIC ASSISTED LAPAROSCOPIC CHOLECYSTECTOMY-MULTI SITE (N/A) as a surgical intervention .  The patient's history has been reviewed, patient examined, no change in status, stable for surgery.  I have reviewed the patient's chart and labs.  Questions were answered to the patient's satisfaction.     Diego F Pabon

## 2018-04-14 NOTE — OR Nursing (Signed)
Patient resting vss med given for pain  Pain is less med given for nuasea

## 2018-04-14 NOTE — Transfer of Care (Signed)
Immediate Anesthesia Transfer of Care Note  Patient: Carla Vincent  Procedure(s) Performed: ROBOTIC ASSISTED LAPAROSCOPIC CHOLECYSTECTOMY-MULTI SITE (N/A Abdomen)  Patient Location: PACU  Anesthesia Type:General  Level of Consciousness: drowsy  Airway & Oxygen Therapy: Patient Spontanous Breathing and Patient connected to face mask oxygen  Post-op Assessment: Report given to RN and Post -op Vital signs reviewed and stable  Post vital signs: Reviewed and stable  Last Vitals:  Vitals Value Taken Time  BP 128/66 04/14/2018  9:56 AM  Temp    Pulse 93 04/14/2018  9:58 AM  Resp 19 04/14/2018  9:58 AM  SpO2 100 % 04/14/2018  9:58 AM  Vitals shown include unvalidated device data.  Last Pain:  Vitals:   04/14/18 0633  TempSrc: Oral  PainSc: 0-No pain         Complications: No apparent anesthesia complications

## 2018-04-14 NOTE — Anesthesia Postprocedure Evaluation (Signed)
Anesthesia Post Note  Patient: Carla Vincent  Procedure(s) Performed: ROBOTIC ASSISTED LAPAROSCOPIC CHOLECYSTECTOMY-MULTI SITE (N/A Abdomen)  Patient location during evaluation: PACU Anesthesia Type: General Level of consciousness: awake and alert Pain management: pain level controlled Vital Signs Assessment: post-procedure vital signs reviewed and stable Respiratory status: spontaneous breathing and respiratory function stable Cardiovascular status: stable Anesthetic complications: no     Last Vitals:  Vitals:   04/14/18 1041 04/14/18 1056  BP: (!) 143/80 121/85  Pulse: 90 89  Resp: 15 13  Temp:    SpO2: 96% 96%    Last Pain:  Vitals:   04/14/18 1041  TempSrc:   PainSc: 8                  Engelbert Sevin K

## 2018-04-17 LAB — SURGICAL PATHOLOGY

## 2018-04-17 NOTE — Unmapped (Signed)
Baseline MTX labs wnl, ok to begin once she is cleared by her surgeon.  She will notify us.

## 2018-04-26 ENCOUNTER — Encounter: Payer: Self-pay | Admitting: Surgery

## 2018-04-26 ENCOUNTER — Other Ambulatory Visit: Payer: Self-pay

## 2018-04-26 ENCOUNTER — Ambulatory Visit (INDEPENDENT_AMBULATORY_CARE_PROVIDER_SITE_OTHER): Payer: Medicaid Other | Admitting: Surgery

## 2018-04-26 VITALS — BP 133/77 | HR 80 | Temp 97.8°F | Ht 62.0 in | Wt 159.0 lb

## 2018-04-26 DIAGNOSIS — Z09 Encounter for follow-up examination after completed treatment for conditions other than malignant neoplasm: Secondary | ICD-10-CM

## 2018-04-26 DIAGNOSIS — Z01818 Encounter for other preprocedural examination: Secondary | ICD-10-CM

## 2018-04-26 DIAGNOSIS — Z01812 Encounter for preprocedural laboratory examination: Secondary | ICD-10-CM

## 2018-04-26 NOTE — Unmapped (Signed)
Patient called and states she already has had surgery and she has been OK'd to start on her MTX.  Needs instructions.

## 2018-04-26 NOTE — Progress Notes (Signed)
S/p robotic chole 6/21 Path d/w pt in detail She is doing very well, no pain, Taking PO, no fevers  PE NAD Abd: soft, incisions c/d/i, no infection  A/P Doing well No heavy lifting RTC prn

## 2018-04-26 NOTE — Patient Instructions (Signed)

## 2018-04-27 NOTE — Unmapped (Signed)
Called to discuss but unable to reach x 1. Left VM. Will attempt to call at later time.

## 2018-04-28 MED ORDER — FOLIC ACID 1 MG TABLET
ORAL_TABLET | Freq: Every day | ORAL | 3 refills | 0.00000 days | Status: CP
Start: 2018-04-28 — End: 2018-07-10

## 2018-04-28 MED ORDER — METHOTREXATE SODIUM 2.5 MG TABLET
ORAL_TABLET | ORAL | 0 refills | 0.00000 days | Status: CP
Start: 2018-04-28 — End: 2018-06-05

## 2018-04-29 NOTE — Unmapped (Signed)
Will initiate methotrexate as discussed at her last visit. Start MTX 5 mg weekly x 2 weeks. She will have monitoring labs drawn at [facility] after second MTX dose. If wnl, will increase to MTX 10 mg weekly. Advised to start FA 1 mg daily on all of the days that she does not take the MTX.

## 2018-06-03 LAB — CBC W/ DIFFERENTIAL
BANDED NEUTROPHILS ABSOLUTE COUNT: 0 10*3/uL (ref 0.0–0.1)
BASOPHILS ABSOLUTE COUNT: 0.1 10*3/uL (ref 0.0–0.2)
BASOPHILS RELATIVE PERCENT: 1 %
EOSINOPHILS ABSOLUTE COUNT: 0.1 10*3/uL (ref 0.0–0.4)
EOSINOPHILS RELATIVE PERCENT: 2 %
HEMATOCRIT: 43.8 % (ref 34.0–46.6)
HEMOGLOBIN: 14.1 g/dL (ref 11.1–15.9)
IMMATURE GRANULOCYTES: 0 %
LYMPHOCYTES ABSOLUTE COUNT: 2.8 10*3/uL (ref 0.7–3.1)
LYMPHOCYTES RELATIVE PERCENT: 39 %
MEAN CORPUSCULAR HEMOGLOBIN CONC: 32.2 g/dL (ref 31.5–35.7)
MEAN CORPUSCULAR HEMOGLOBIN: 27.3 pg (ref 26.6–33.0)
MEAN CORPUSCULAR VOLUME: 85 fL (ref 79–97)
MONOCYTES ABSOLUTE COUNT: 0.4 10*3/uL (ref 0.1–0.9)
NEUTROPHILS ABSOLUTE COUNT: 3.7 10*3/uL (ref 1.4–7.0)
PLATELET COUNT: 222 10*3/uL (ref 150–450)
RED BLOOD CELL COUNT: 5.17 x10E6/uL (ref 3.77–5.28)
RED CELL DISTRIBUTION WIDTH: 13 % (ref 12.3–15.4)
WHITE BLOOD CELL COUNT: 7.1 10*3/uL (ref 3.4–10.8)

## 2018-06-03 LAB — AST (SGOT): Lab: 21

## 2018-06-03 LAB — ALT (SGPT): Lab: 29

## 2018-06-03 LAB — CREATININE: Lab: 0.83

## 2018-06-03 LAB — NEUTROPHILS RELATIVE PERCENT: Lab: 53

## 2018-06-03 LAB — BLOOD UREA NITROGEN: Lab: 11

## 2018-06-04 NOTE — Unmapped (Signed)
Reviewed MTX labs (CBC/d, AST/ALT, BUN/Cr). Notified patient of good result via MyChart. Will increase MTX to 10mg  weekly (from 5mg  weekly) with FA 1mg  on non-MTX days.

## 2018-06-05 MED ORDER — METHOTREXATE SODIUM 2.5 MG TABLET
ORAL_TABLET | 0 refills | 0.00000 days | Status: CP
Start: 2018-06-05 — End: 2018-07-10

## 2018-06-05 NOTE — Unmapped (Signed)
Sent in 1 month 10mg  mtx refill. Patient needs follow up appt before more is to be called in.

## 2018-07-03 NOTE — Unmapped (Signed)
Appointment 07/10/18 at 3:15 pm with [facility] in [place]

## 2018-07-03 NOTE — Unmapped (Signed)
Spoke with patient advised would need a follow up appointment before more medication is given. forwarding to scheduling

## 2018-07-04 ENCOUNTER — Ambulatory Visit
Admission: EM | Admit: 2018-07-04 | Discharge: 2018-07-04 | Disposition: A | Discharge status: Home / Self Care | Payer: Medicaid Other | Attending: Family Medicine | Admitting: Family Medicine

## 2018-07-04 ENCOUNTER — Other Ambulatory Visit: Payer: Self-pay

## 2018-07-04 DIAGNOSIS — R05 Cough: Secondary | ICD-10-CM

## 2018-07-04 DIAGNOSIS — B9789 Other viral agents as the cause of diseases classified elsewhere: Secondary | ICD-10-CM

## 2018-07-04 DIAGNOSIS — J069 Acute upper respiratory infection, unspecified: Secondary | ICD-10-CM

## 2018-07-04 MED ORDER — ALBUTEROL SULFATE HFA 108 (90 BASE) MCG/ACT IN AERS: 1.0000 | INHALATION_SPRAY | Freq: Four times a day (QID) | RESPIRATORY_TRACT | 0 refills | Status: DC | PRN

## 2018-07-04 MED ORDER — FLUTICASONE PROPIONATE 50 MCG/ACT NA SUSP: 2.0000 | Freq: Every day | NASAL | 0 refills | Status: DC

## 2018-07-04 MED ORDER — GUAIFENESIN-CODEINE 100-10 MG/5ML PO SYRP: 5.0000 mL | ORAL_SOLUTION | Freq: Three times a day (TID) | ORAL | 0 refills | Status: DC | PRN

## 2018-07-04 MED ORDER — BENZONATATE 200 MG PO CAPS: ORAL_CAPSULE | ORAL | 0 refills | Status: DC

## 2018-07-04 NOTE — ED Triage Notes (Signed)
Patient complains of cough, runny nose, nasal congestion x 5 days. Patient states that her whole torso hurts from cough, productive coughing.

## 2018-07-04 NOTE — ED Provider Notes (Signed)
[facility]    CSN: [id number] Arrival date & time: 07/04/18  1350     History   Chief Complaint Chief Complaint  Patient presents with  . Cough    HPI Carla Vincent is a 27 y.o. female.   HPI  -year-old female presents with cough runny nose and nasal congestion for 5 days.  She states that her daughter started having the same symptoms on Friday but only had the nasal congestion did not develop  cough.  Unfortunately, the patient and her husband both developed the nasal congestion that developed into the cough.  She states that she hurts all over but when she coughs she has pain in her anterior chest.  Non-smoker.  Is that she has not noticed any production to her cough.  No fever or chills and is afebrile today.  O2 sats on room air 98%.        Past Medical History:  Diagnosis Date  . Gallstones 03/2018  . History of kidney stones   . Left lower quadrant abdominal tenderness 03/15/2016  . Nausea & vomiting 05/09/2016    Patient Active Problem List   Diagnosis Date Noted  . Chronic cholecystitis   . Labor and delivery, indication for care 06/28/2016  . Nausea & vomiting 05/09/2016  . Left lower quadrant abdominal tenderness 03/15/2016    Past Surgical History:  Procedure Laterality Date  . ROBOTIC ASSISTED LAPAROSCOPIC CHOLECYSTECTOMY-MULTI SITE N/A 04/14/2018   Procedure: ROBOTIC ASSISTED LAPAROSCOPIC CHOLECYSTECTOMY-MULTI SITE;  Surgeon: Leafy Ro, MD;  Location: [facility] ORS;  Service: General;  Laterality: N/A;    OB History    Gravida  1   Para  1   Term  1   Preterm      AB      Living  1     SAB      TAB      Ectopic      Multiple  0   Live Births  1            Home Medications    Prior to Admission medications   Medication Sig Start Date End Date Taking? Authorizing Provider  folic acid (FOLVITE) 1 MG tablet TAKE 1 TABLET BY MOUTH ONCE DAILY ON ALL THE DAYS YOU DO NOT TAKE METHOTREXATE 05/10/18  Yes [provider]  methotrexate 2.5 MG tablet TAKE 4 TABLETS BY MOUTH ONCE A WEEK 06/06/18  Yes [provider]  albuterol (PROVENTIL HFA;VENTOLIN HFA) 108 (90 Base) MCG/ACT inhaler Inhale 1-2 puffs into the lungs every 6 (six) hours as needed for wheezing or shortness of breath. Use with spacer 07/04/18   Lutricia Feil, PA-C  benzonatate (TESSALON) 200 MG capsule Take one cap TID PRN cough 07/04/18   Ovid Curd P, PA-C  fluticasone [facility]) 50 MCG/ACT nasal spray Place 2 sprays into both nostrils daily. 07/04/18   Lutricia Feil, PA-C  guaiFENesin-codeine (CHERATUSSIN AC) 100-10 MG/5ML syrup Take 5 mLs by mouth 3 (three) times daily as needed for cough. 07/04/18   Lutricia Feil, PA-C    Family History Family History  Problem Relation Age of Onset  . Uterine cancer Maternal Aunt   . Breast cancer Paternal Aunt   . Diabetes Paternal Grandfather     Social History Social History   Tobacco Use  . Smoking status: Never Smoker  . Smokeless tobacco: Never Used  Substance Use Topics  . Alcohol use: Yes    Comment: occasionally  . Drug use:  Yes    Types: Marijuana     Allergies   Patient has no known allergies.   Review of Systems Review of Systems  Constitutional: Positive for activity change. Negative for appetite change, chills, fatigue and fever.  HENT: Positive for congestion, postnasal drip and rhinorrhea.   Respiratory: Positive for cough and shortness of breath. Negative for wheezing and stridor.   All other systems reviewed and are negative.    Physical Exam Triage Vital Signs ED Triage Vitals  Enc Vitals Group     BP 07/04/18 1407 130/74     Pulse Rate 07/04/18 1407 88     Resp 07/04/18 1407 18     Temp 07/04/18 1407 98.7 F (37.1 C)     Temp Source 07/04/18 1407 Oral     SpO2 07/04/18 1407 98 %     Weight 07/04/18 1404 150 lb (68 kg)     Height 07/04/18 1404 5\' 2"  (1.575 m)     Head Circumference --      Peak Flow --      Pain Score  07/04/18 1404 6     Pain Loc --      Pain Edu? --      Excl. in GC? --    No data found.  Updated Vital Signs BP 130/74 (BP Location: Right Arm)   Pulse 88   Temp 98.7 F (37.1 C) (Oral)   Resp 18   Ht 5\' 2"  (1.575 m)   Wt 150 lb (68 kg)   LMP 06/01/2018   SpO2 98%   Breastfeeding? No   BMI 27.44 kg/m   Visual Acuity Right Eye Distance:   Left Eye Distance:   Bilateral Distance:    Right Eye Near:   Left Eye Near:    Bilateral Near:     Physical Exam  Constitutional: She is oriented to person, place, and time. She appears well-developed and well-nourished. No distress.  HENT:  Head: Normocephalic.  Right Ear: External ear normal.  Left Ear: External ear normal.  Nose: Nose normal.  Mouth/Throat: Oropharynx is clear and moist. No oropharyngeal exudate.  Eyes: Pupils are equal, round, and reactive to light. Right eye exhibits no discharge. Left eye exhibits no discharge.  Neck: Normal range of motion.  Pulmonary/Chest: Effort normal and breath sounds normal. No stridor. She has no wheezes. She has no rales.  Musculoskeletal: Normal range of motion.  Lymphadenopathy:    She has no cervical adenopathy.  Neurological: She is alert and oriented to person, place, and time.  Skin: Skin is warm and dry. She is not diaphoretic.  Psychiatric: She has a normal mood and affect. Her behavior is normal. Judgment and thought content normal.  Nursing note and vitals reviewed.    UC Treatments / Results  Labs (all labs ordered are listed, but only abnormal results are displayed) Labs Reviewed - No data to display  EKG None  Radiology No results found.  Procedures Procedures (including critical care time)  Medications Ordered in UC Medications - No data to display  Initial Impression / Assessment and Plan / UC Course  I have reviewed the triage vital signs and the nursing notes.  Pertinent labs & imaging results that were available during my care of the patient  were reviewed by me and considered in my medical decision making (see chart for details).   I have told the patient this is likely a viral illness and does not require antibiotics at this time.  However if  she runs high fevers or the cough becomes more productive lasts for 7 days she should return to our clinic or her primary care physician for reevaluation.  Time we will treat her with cough suppressants and albuterol inhaler.  Recommended Flonase for the next 2 to 3 weeks.   Final Clinical Impressions(s) / UC Diagnoses   Final diagnoses:  Viral URI with cough     Discharge Instructions     Drink plenty of fluids.  Tylenol or Motrin for body aches or pain.    ED Prescriptions    Medication Sig Dispense Auth. Provider   fluticasone (FLONASE) 50 MCG/ACT nasal spray Place 2 sprays into both nostrils daily. 16 g Ovid Curd P, PA-C   benzonatate (TESSALON) 200 MG capsule Take one cap TID PRN cough 21 capsule Ovid Curd P, PA-C   guaiFENesin-codeine (CHERATUSSIN AC) 100-10 MG/5ML syrup Take 5 mLs by mouth 3 (three) times daily as needed for cough. 60 mL Ovid Curd P, PA-C   albuterol (PROVENTIL HFA;VENTOLIN HFA) 108 (90 Base) MCG/ACT inhaler Inhale 1-2 puffs into the lungs every 6 (six) hours as needed for wheezing or shortness of breath. Use with spacer 1 Inhaler Lutricia Feil, PA-C     Controlled Substance Prescriptions [place] Controlled Substance Registry consulted? Not Applicable   Lutricia Feil, PA-C 07/04/18 1534

## 2018-07-04 NOTE — Discharge Instructions (Signed)
Drink plenty of fluids.  Tylenol or Motrin for body aches or pain.

## 2018-07-10 ENCOUNTER — Ambulatory Visit: Admit: 2018-07-10 | Discharge: 2018-07-11 | Payer: MEDICAID

## 2018-07-10 DIAGNOSIS — Z79899 Other long term (current) drug therapy: Principal | ICD-10-CM

## 2018-07-10 DIAGNOSIS — L409 Psoriasis, unspecified: Secondary | ICD-10-CM

## 2018-07-10 MED ORDER — METHOTREXATE SODIUM 2.5 MG TABLET
ORAL_TABLET | 2 refills | 0 days | Status: CP
Start: 2018-07-10 — End: 2019-05-11

## 2018-07-10 MED ORDER — CLOBETASOL 0.05 % SCALP SOLUTION
Freq: Two times a day (BID) | TOPICAL | 4 refills | 0 days | Status: CP
Start: 2018-07-10 — End: 2019-01-13

## 2018-07-10 MED ORDER — FOLIC ACID 1 MG TABLET
ORAL_TABLET | Freq: Every day | ORAL | 3 refills | 0 days | Status: CP
Start: 2018-07-10 — End: 2019-05-11

## 2018-07-10 MED ORDER — CLOBETASOL 0.05 % TOPICAL OINTMENT
Freq: Two times a day (BID) | TOPICAL | 4 refills | 0 days | Status: CP
Start: 2018-07-10 — End: 2019-05-11

## 2018-07-10 NOTE — Unmapped (Signed)
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 Will increase MTX dose to 15mg  weekly. And folic acid to 3mg  daily on non mtx days. Refills provided. Recommended she restart topical treatments as well  - For the body-- restart clobetasol (TEMOVATE) 0.05 % ointment; Apply topically Two (2) times a day. On the psoriasis spots until clear then stop.  Refills provided  - restart clobetasol (TEMOVATE) 0.05 % external solution for the scalp; Apply topically Two (2) times a day. To the scalp refills provided  - Also recommended t sal shampoo for scalp to assist with descaling  - Again discussed the abortifacient and teratogenic effects of mtx with patient. She understands these risks and is not trying to get pregnant at this time. She knows to let us know if this changes  - Counseled on potential risks of topical steroids including skin atrophy, hypopigmentation, or striae. Counseled on appropriate use to minimize risk of side effects.  - MTX cumulative dose = 120mg      High Risk Medication use.  - CBC and differential; Future  - AST; Future  - ALT; Future  - BUN; Future  - Creatinine; Future      RTC: Return in about 3 months (around 10/09/2018) for Recheck.      CC: Psoriasis     HPI:  This is a pleasant 27 y.o. female who was last seen by Dr. Linton Rump and Dr. Dreama Saa in 02/2018 for psoriasiform dermatitis (biopsy supported) who presents today for follow up of the same. Since LV, she has been on mtx 10mg  weekly. She has not been using any topicals as she did not think she could use both this and the MTX. She reports that involvement on arms has resolved, but plaque on leg remains pruritic and scaly. Scalp is also very scaly and pruritic since stopping the topicals.  Denies fatigue or gi upset with mtx. Denies other side effects and is tolerating without issue.    No fam hx of psoriasis. Denies joint pains. No other skin concerns.    Patient is otherwise healthy. No longer breastfeeding.    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  See comment.           Family History: Negative for psoriasis    Social History: Has young daughter    ROS: Baseline state of health. No fevers, chills, headaches, joint pains or other skin concerns.     PE:  General: Well-developed, well-nourished female in no acute distress, resting comfortably.  Neuro: Alert and oriented, answers questions appropriately.  Skin: Examination of the head, neck, arms, and lower extremities was performed and notable for the following:  - Well-demarcated erythematous plaques with overlying silvery scale of occipital and crown of scalp, left anterior shin  - arms clear  - Nails clear  - All other areas examined were normal or had no significant findings.     ____________________________________________________________________

## 2018-07-15 LAB — CBC W/ DIFFERENTIAL
BANDED NEUTROPHILS ABSOLUTE COUNT: 0 10*3/uL (ref 0.0–0.1)
BASOPHILS ABSOLUTE COUNT: 0.1 10*3/uL (ref 0.0–0.2)
BASOPHILS RELATIVE PERCENT: 1 %
EOSINOPHILS ABSOLUTE COUNT: 0.1 10*3/uL (ref 0.0–0.4)
EOSINOPHILS RELATIVE PERCENT: 1 %
HEMOGLOBIN: 14.1 g/dL (ref 11.1–15.9)
IMMATURE GRANULOCYTES: 1 %
LYMPHOCYTES RELATIVE PERCENT: 30 %
MEAN CORPUSCULAR HEMOGLOBIN CONC: 34 g/dL (ref 31.5–35.7)
MEAN CORPUSCULAR HEMOGLOBIN: 28.1 pg (ref 26.6–33.0)
MEAN CORPUSCULAR VOLUME: 83 fL (ref 79–97)
MONOCYTES ABSOLUTE COUNT: 0.4 10*3/uL (ref 0.1–0.9)
MONOCYTES RELATIVE PERCENT: 5 %
NEUTROPHILS ABSOLUTE COUNT: 5 10*3/uL (ref 1.4–7.0)
NEUTROPHILS RELATIVE PERCENT: 62 %
PLATELET COUNT: 200 10*3/uL (ref 150–450)
RED BLOOD CELL COUNT: 5.01 x10E6/uL (ref 3.77–5.28)
RED CELL DISTRIBUTION WIDTH: 13.6 % (ref 12.3–15.4)

## 2018-07-15 LAB — ALT: ALT (SGPT): 51 IU/L — ABNORMAL HIGH (ref 0–32)

## 2018-07-15 LAB — MEAN CORPUSCULAR VOLUME: Lab: 83

## 2018-07-15 LAB — CREATININE
CREATININE: 0.71 mg/dL (ref 0.57–1.00)
GFR MDRD AF AMER: 134 mL/min/{1.73_m2}
Lab: 0.71

## 2018-07-15 LAB — BLOOD UREA NITROGEN: Lab: 10

## 2018-07-15 LAB — ALT (SGPT): Lab: 51 — ABNORMAL HIGH

## 2018-07-15 LAB — AST (SGOT): Lab: 22

## 2018-07-17 NOTE — Unmapped (Signed)
Patient notified of acceptable lab results. ALT mildly elevated, will recheck at next visit to ensure normalization.

## 2018-11-02 NOTE — Unmapped (Deleted)
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 Will increase MTX dose to 15mg  weekly. And folic acid to 3mg  daily on non mtx days. Refills provided. Recommended she restart topical treatments as well  - For the body-- restart clobetasol (TEMOVATE) 0.05 % ointment; Apply topically Two (2) times a day. On the psoriasis spots until clear then stop.  Refills provided  - restart clobetasol (TEMOVATE) 0.05 % external solution for the scalp; Apply topically Two (2) times a day. To the scalp refills provided  - Also recommended t sal shampoo for scalp to assist with descaling  - Again discussed the abortifacient and teratogenic effects of mtx with patient. She understands these risks and is not trying to get pregnant at this time. She knows to let us know if this changes  - Counseled on potential risks of topical steroids including skin atrophy, hypopigmentation, or striae. Counseled on appropriate use to minimize risk of side effects.  - MTX cumulative dose = 120mg      High Risk Medication use.  - CBC and differential; Future  - AST; Future  - ALT; Future  - BUN; Future  - Creatinine; Future      RTC: No follow-ups on file.      CC: Psoriasis     HPI:  This is a pleasant 28 y.o. female who was last seen by myself in 06/2018 for psoriasiform dermatitis (biopsy supported) who presents today for follow up of the same. Since LV, she has been on mtx 15mg  weekly. She has not been using any topicals as she did not think she could use both this and the MTX. She reports that involvement on arms has resolved, but plaque on leg remains pruritic and scaly. Scalp is also very scaly and pruritic since stopping the topicals.  Denies fatigue or gi upset with mtx. Denies other side effects and is tolerating without issue.    No fam hx of psoriasis. Denies joint pains. No other skin concerns.    Patient is otherwise healthy. No longer breastfeeding.    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  See comment.           Family History: Negative for psoriasis    Social History: Has young daughter    ROS: Baseline state of health. No fevers, chills, headaches, joint pains or other skin concerns.     PE:  General: Well-developed, well-nourished female in no acute distress, resting comfortably.  Neuro: Alert and oriented, answers questions appropriately.  Skin: Examination of the head, neck, arms, and lower extremities was performed and notable for the following:  - Well-demarcated erythematous plaques with overlying silvery scale of occipital and crown of scalp, left anterior shin  - arms clear  - Nails clear  - All other areas examined were normal or had no significant findings.     ____________________________________________________________________

## 2019-01-13 DIAGNOSIS — L409 Psoriasis, unspecified: Principal | ICD-10-CM

## 2019-01-15 MED ORDER — CLOBETASOL 0.05 % SCALP SOLUTION
Freq: Two times a day (BID) | TOPICAL | 4 refills | 0.00000 days | Status: CP
Start: 2019-01-15 — End: 2019-05-11

## 2019-05-11 ENCOUNTER — Ambulatory Visit: Admit: 2019-05-11 | Discharge: 2019-05-12 | Payer: MEDICAID

## 2019-05-11 DIAGNOSIS — L409 Psoriasis, unspecified: Secondary | ICD-10-CM

## 2019-05-11 DIAGNOSIS — B360 Pityriasis versicolor: Principal | ICD-10-CM

## 2019-05-11 MED ORDER — KETOCONAZOLE 2 % TOPICAL CREAM
Freq: Every day | TOPICAL | 11 refills | 120 days | Status: CP
Start: 2019-05-11 — End: 2019-05-11

## 2019-05-11 MED ORDER — CLOBETASOL 0.05 % SCALP SOLUTION
Freq: Two times a day (BID) | TOPICAL | 4 refills | 0 days | Status: CP
Start: 2019-05-11 — End: 2020-05-10

## 2019-05-11 NOTE — Unmapped (Addendum)
Patient Education   Use the shampoo as a body wash a few times a week.  Use the cream daily as needed.  The brown discoloration will take longer to fade.  Some people have a tendency to this rash and you may continue to get it from time to time.     Tinea Versicolor: Care Instructions  Your Care Instructions  Tinea versicolor is a skin infection caused by a yeast (fungus). It causes many small spots, usually on the chest and back. The spotted skin can be flaky or scaly. The spots do not tan in the sun, so they are lighter than the skin around them. Some spots may be darker than the skin around them.  The yeast that causes tinea versicolor normally lives on your skin. But it becomes a problem only when warmth and humidity allow the yeast to grow rapidly and increase in number. Some people are more likely to get tinea versicolor. It does not spread from person to person. Tinea versicolor usually gets better as you age.  You can treat tinea versicolor with cream or ointment that kills the yeast. You may need pills to kill the fungus if the spots cover a lot of your body. Although treatment kills the yeast quickly, your skin may not return to normal for months after treatment. You can get this condition again after treatment.  Follow-up care is a key part of your treatment and safety. Be sure to make and go to all appointments, and call your doctor if you are having problems. It's also a good idea to know your test results and keep a list of the medicines you take.  How can you care for yourself at home?  ?? Follow the directions for use of creams, shampoos, or solutions. You will probably need to use them for 1 to 2 weeks. If your skin gets irritated, stop using the product, and call your doctor.  ?? To prevent tinea versicolor, use a cream, shampoo, or solution one time a month. Your doctor may prescribe pills to prevent the spots from returning.  ?? Dry off well after bathing. Keep your skin clean and dry.  ?? Always wear sunscreen on exposed skin. Make sure to use a broad-spectrum sunscreen that has a sun protection factor (SPF) of 30 or higher. Use it every day, even when it is cloudy.  ?? If you keep getting tinea versicolor, wash your clothes in very hot water to kill the yeast.  When should you call for help?   Call your doctor now or seek immediate medical care if:  ?? You have signs of infection such as:  ? Pain, warmth, or swelling in your skin.  ? Red streaks near a wound in your skin.  ? Pus coming from a wound in your skin.  ? A fever.  Watch closely for changes in your health, and be sure to contact your doctor if:  ?? Your skin condition does not improve in 2 weeks.  ?? You do not get better as expected.  Where can you learn more?  Go to [facility] at https://my[facility]chart.org  [facility] under [employer]. Enter [phone] in the search box to learn more about Tinea Versicolor: Care Instructions.  Current as of: August 24, 2018??????????????????????????????Content Version: 12.5  ?? 2006-2020 Healthwise, Incorporated.   Care instructions adapted under license by [facility]. If you have questions about a medical condition or this instruction, always ask your healthcare professional. [employer] disclaims any  warranty or liability for your use of this information.

## 2019-05-11 NOTE — Unmapped (Signed)
Dermatology Clinic Note    ASSESSMENT AND PLAN:    Psoriasiform Dermatitis, nearly resolved:    -Patient has discontinued methotrexate with no flare  -Only residual area of involvement is the scalp, which resolves within a few days of use of clobetasol solution  -Continue clobetasol (TEMOVATE) 0.05 % external solution for the scalp; Apply topically Two (2) times a day. To the scalp refills provided  -Continue t sal shampoo for scalp to assist with descaling    Tinea versicolor, new problem:  -Clinical appearance classic for tinea versicolor  -KOH confirmatory with small yeast and short hyphae  -Start ketoconazole shampoo as a body wash twice weekly as needed  -Start ketoconazole cream twice daily as needed  -Discussed that this condition can recur from time to time  -She will contact her office if she does not have resolution with treatment  -Discussed that pigmentation of lesions may last after treatment, but scale, erythema and pruritus should resolve    RTC: Return if symptoms worsen or fail to improve.      CC: New rash    HPI:  This is a pleasant 28 y.o. female who was last seen 07/10/18 for psoriasiform dermatitis (biopsy supported) who presents today for a new rash.    At the last visit, she was seen for psoriasiform dermatitis.  We increased methotrexate to 15 mg weekly and folic acid to 3 mg daily on non-methotrexate days.  We continued clobetasol ointment for the body and clobetasol scalp solution as well as T. Sal shampoo.  Labs at that visit were normal.    Since LV, dermatitis cleared.  She has been stopped methotrexate as it was causing her to feel sick.  The only area that still remains itchy from time to time and scalp.  She continues clobetasol scalp solution weekly as needed with good results.      Presents today for new rash on her upper trunk and upper back present for few months and itching.  She not tried any treatment.  She had this once before when she was a teenager and it cleared.  Relieving brown spots.  It is not similar to her previous rash.      No fam hx of psoriasis. Denies joint pains. No other skin concerns.    Patient is otherwise healthy. No longer breastfeeding.    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  See comment.         Family History: Negative for psoriasis    Social History: Has young daughter    ROS: Baseline state of health. No fevers, chills, headaches, joint pains or other skin concerns.     PE:  General: Well-developed, well-nourished female in no acute distress, resting comfortably.  Neuro: Alert and oriented, answers questions appropriately.  Skin: Examination of the scalp, face, neck, chest, abdomen, back, bilateral upper extremities was performed and notable for the following:  - Well-demarcated erythematous to hyperpigmented plaques with overlying thin scale over the chest and back  - All other areas examined were normal or had no significant findings.     KOH performed and positive for short hyphae and small round yeast structures.  ____________________________________________________________________

## 2019-05-14 MED ORDER — KETOCONAZOLE 2 % SHAMPOO
TOPICAL | 11 refills | 0 days | Status: CP
Start: 2019-05-14 — End: ?

## 2020-01-10 NOTE — Unmapped (Signed)
Patient Education        Well Visit, Ages 63 to 37: Care Instructions  Overview     Well visits can help you stay healthy. Your doctor has checked your overall health and may have suggested ways to take good care of yourself. Your doctor also may have recommended tests. At home, you can help prevent illness with healthy eating, regular exercise, and other steps.  Follow-up care is a key part of your treatment and safety. Be sure to make and go to all appointments, and call your doctor if you are having problems. It's also a good idea to know your test results and keep a list of the medicines you take.  How can you care for yourself at home?  ?? Get screening tests that you and your doctor decide on. Screening helps find diseases before any symptoms appear.  ?? Eat healthy foods. Choose fruits, vegetables, whole grains, protein, and low-fat dairy foods. Limit fat, especially saturated fat. Reduce salt in your diet.  ?? Limit alcohol. If you are a man, have no more than 2 drinks a day or 14 drinks a week. If you are a woman, have no more than 1 drink a day or 7 drinks a week.  ?? Get at least 30 minutes of physical activity on most days of the week. Walking is a good choice. You also may want to do other activities, such as running, swimming, cycling, or playing tennis or team sports. Discuss any changes in your exercise program with your doctor.  ?? Reach and stay at a healthy weight. This will lower your risk for many problems, such as obesity, diabetes, heart disease, and high blood pressure.  ?? Do not smoke or allow others to smoke around you. If you need help quitting, talk to your doctor about stop-smoking programs and medicines. These can increase your chances of quitting for good.  ?? Care for your mental health. It is easy to get weighed down by worry and stress. Learn strategies to manage stress, like deep breathing and mindfulness, and stay connected with your family and community. If you find you often feel sad or hopeless, talk with your doctor. Treatment can help.  ?? Talk to your doctor about whether you have any risk factors for sexually transmitted infections (STIs). You can help prevent STIs if you wait to have sex with a new partner (or partners) until you've each been tested for STIs. It also helps if you use condoms (female or female condoms) and if you limit your sex partners to one person who only has sex with you. Vaccines are available for some STIs, such as HPV.  ?? Use birth control if it's important to you to prevent pregnancy. Talk with your doctor about the choices available and what might be best for you.  ?? If you think you may have a problem with alcohol or drug use, talk to your doctor. This includes prescription medicines (such as amphetamines and opioids) and illegal drugs (such as cocaine and methamphetamine). Your doctor can help you figure out what type of treatment is best for you.  ?? Protect your skin from too much sun. When you're outdoors from 10 a.m. to 4 p.m., stay in the shade or cover up with clothing and a hat with a wide brim. Wear sunglasses that block UV rays. Even when it's cloudy, put broad-spectrum sunscreen (SPF 30 or higher) on any exposed skin.  ?? See a dentist one or two times a year  for checkups and to have your teeth cleaned.  ?? Wear a seat belt in the car.  When should you call for help?  Watch closely for changes in your health, and be sure to contact your doctor if you have any problems or symptoms that concern you.  Where can you learn more?  Go to [facility] at https://my[facility]chart.org  Select Patient Education under the Resources menu. Enter P072 in the search box to learn more about Well Visit, Ages 18 to 50: Care Instructions.  Current as of: Mar 21, 2019??????????????????????????????Content Version: 12.8  ?? 2006-2021 Healthwise, Incorporated.   Care instructions adapted under license by [facility]. If you have questions about a medical condition or this instruction, always ask your healthcare professional. Healthwise, Incorporated disclaims any warranty or liability for your use of this information.

## 2020-01-14 NOTE — Unmapped (Signed)
Assessment and Plan:     Kaitlyn Carson was seen today for annual exam.    Diagnoses and all orders for this visit:    Annual physical exam  Discussed healthy behaviors including well balanced diet and regular exercise.  CBC and CMP collected in clinic today. Will contact patient with results.   -     CBC  -     Comprehensive Metabolic Panel    Screening for cholesterol level  Lipid panel collected in clinic today. Will contact patient with results.   -     Lipid Panel    Weight gain  TSH collected in clinic today. Will contact patient with results.   -     TSH    Screening for cervical cancer  Pap smear in clinic today. Will contact patient with results.   -     Pap Smear    Need for tetanus, diphtheria, and acellular pertussis (Tdap) vaccine  Tdap vaccine administered in clinic today.  -     Tdap vaccine greater than or equal to 7yo IM    Screening for tuberculosis  PPD placed in clinic today. Patient will return on 01/17/20 for reading (nurse visit).   -     TB Skin Test    Counseled on HM recommendations. Patient defers influenza vaccine.    Medication adherence and barriers to the treatment plan have been addressed. Opportunities to optimize healthy behaviors have been discussed. Patient / caregiver voiced understanding.    Subjective:      Kaitlyn Carson 28 y.o.female is being seen for a health maintenance and gynecologic exam. Last pap smear was 3 years ago with a normal pap and negative HPV.  Patient's last menstrual period was 01/06/2020 (exact date).    Chief Complaint   Patient presents with   ??? Annual Exam     Lifestyle:  Employment: Geophysicist/field seismologist at [employer]  Caffeine: occasional soda; no coffee or tea  Diet: overall well balanced; eats healthy for the most part  Exercise: none devoted  Last Dentist: >1 year ago, no concerns, plans to schedule appt when she establishes new insurance  Last Vision: none recent; does not wear eyeglasses or contacts; reports new onset photophobia at night and blurred distance vision    She reports hx of fluctuating weight, noting she was previously 170 lbs after gaining weight, but has been losing weight again over past several months.   FHx includes thyroid disorder (aunt). She states thyroid levels have been normal in the past, but requests recheck today.     Patient requests completion of paperwork for employer ([employer]).        Social History:     Social History     Socioeconomic History   ??? Marital status: Single     Spouse name: None   ??? Number of children: None   ??? Years of education: None   ??? Highest education level: None   Occupational History   ??? None   Social Needs   ??? Financial resource strain: None   ??? Food insecurity     Worry: None     Inability: None   ??? Transportation needs     Medical: None     Non-medical: None   Tobacco Use   ??? Smoking status: Never Smoker   ??? Smokeless tobacco: Never Used   Substance and Sexual Activity   ??? Alcohol use: Yes     Comment: occassional    ??? Drug  use: No   ??? Sexual activity: None   Lifestyle   ??? Physical activity     Days per week: None     Minutes per session: None   ??? Stress: None   Relationships   ??? Social Wellsite geologist on phone: None     Gets together: None     Attends religious service: None     Active member of club or organization: None     Attends meetings of clubs or organizations: None     Relationship status: None   Other Topics Concern   ??? Do you use sunscreen? Yes   ??? Tanning bed use? No   ??? Are you easily burned? Yes   ??? Excessive sun exposure? No   ??? Blistering sunburns? No   Social History Narrative   ??? None       Past Medical/Surgical History:     Past Medical History:   Diagnosis Date   ??? Kidney stone    ??? Psoriasis      No past surgical history on file.     Family History:     Family History   Problem Relation Age of Onset   ??? Melanoma Neg Hx    ??? Basal cell carcinoma Neg Hx    ??? Squamous cell carcinoma Neg Hx        Allergies:     Patient has no known allergies.    Current Medications: Current Outpatient Medications   Medication Sig Dispense Refill   ??? clobetasoL (TEMOVATE) 0.05 % external solution Apply topically Two (2) times a day. To the scalp 100 mL 4   ??? ketoconazole (NIZORAL) 2 % cream Apply to rash twice daily 30 g 2   ??? ketoconazole (NIZORAL) 2 % shampoo Apply topically Two (2) times a week. 120 mL 11     No current facility-administered medications for this visit.        Health Maintenance:     Health Maintenance Summary w/Most Recent Date       Status Date      Influenza Vaccine Overdue 06/26/2019      Done 11/09/2017 Imm Admin: Influenza Vaccine Quad (IIV4 PF) 44mo+ injectable    Pap Smear (21-65) Overdue 10/26/2019      Done 10/25/2016 Registry Metric: Pap Smear date     Done 10/25/2016 HM PAP SMEAR    DTaP/Tdap/Td Vaccines Next Due 01/14/2030      Done 01/15/2020 Imm Admin: TdaP    Hepatitis C Screen This plan is no longer active.      Done 04/07/2018 HEPATITIS C ANTIBODY          Immunizations:     Immunization History   Administered Date(s) Administered   ??? Influenza Vaccine Quad (IIV4 PF) 58mo+ injectable 11/09/2017   ??? MMR 07/02/2016   ??? TdaP 01/15/2020   ??? Varicella 07/02/2016       I have reviewed and (if needed) updated the patient's problem list, medications, allergies, past medical and surgical history, preventive services, and social and family history.     ROS:   Gen:  No fever, excessive fatigue; positive for fluctuating weight  EYES: No eye pain, eye discharge; positive for photophobia at night, blurred distance vision  ENT:  No chronic sore throat, ear pain, hearing loss, hoarseness, nosebleeds  CV: No chest pain, palpitations, claudication, edema  RESP: No SOB, chronic cough,wheezing ,DOE, orthopnea, PND  GI: No abdominal pain, swallowing problems, heartburn, nausea, constipation,  diarrhea, rectal bleeding  GU: No dysuria, incontinence, abnormal vaginal bleeding, unusual vaginal d/c, pelvic pain, breast pain or masses  MS: No arthralgias, myalgias, joint swelling  SKIN:  No rashes, changing skin lesions, abnormal bruising or bleeding  PSYCH: No sleep problems, anxiety, depression, abnormal thoughts  NEURO: No chronic headaches, numbness, confusion, difficulty walking, dizziness  Reproductive health:  Period Regularity: overall regular, typically 1 week later each month  Contraception: none    Vital Signs:     Wt Readings from Last 3 Encounters:   01/15/20 59.9 kg (132 lb)   11/09/17 71.7 kg (158 lb)   10/26/15 59 kg (130 lb)     Temp Readings from Last 3 Encounters:   01/15/20 37 ??C (98.6 ??F) (Oral)   03/13/16 36.5 ??C (97.7 ??F) (Oral)   10/26/15 36.6 ??C (97.9 ??F) (Oral)     BP Readings from Last 3 Encounters:   01/15/20 114/58   11/09/17 130/80   03/13/16 116/68     Pulse Readings from Last 3 Encounters:   01/15/20 83   11/09/17 97   03/13/16 107      Estimated body mass index is 24.14 kg/m?? as calculated from the following:    Height as of this encounter: 157.5 cm (5' 2.01).    Weight as of this encounter: 59.9 kg (132 lb).  Facility age limit for growth percentiles is 20 years.    Objective:      General Appearance: Alert, cooperative, no distress, appears stated age.  Head and Neck: Normocephalic, atraumatic. No  masses. No thyromegaly. No bruits.  EENT: Eyes: PERRLA, Conjuntiva clear; Ears: bilateral TMs normal; Nose: septum and turbinates unremarkable; Throat: mucus membranes moist. No tonsilar enlargement or exudates. No OP erythema.  Cardiovascular:  Regular rate and rhythm. Normal S1, S2. No murmurs, rubs or gallops.  Respiratory: Lungs clear to auscultation bilaterally,  Respiratory effort unremarkable. No wheezes or rhonchi.  Breast: Symmetrical. No masses or lumps. No nipple discharge. No abnormal axillary nodes   Gastrointestinal: Abdomen soft and non-tender. + BS, non-distended. No organomegaly or masses.  Genitourinary: Normal female external genitalia. Urethra non-tender and without masses, lesions. Vagina normal in appearance, without discharge, lesions. No significant prolapse. Cervix without stenosis, CMT. Uterus mobile, normal in size and non-tender. No adnexal masses or TTP.   MSK: Gait and station unremarkable. Normal ROM major joints. Normal strength and tone of proximal muscles.  Extremities: No edema. Peripheral pulses normal  Integumentary: Warm and dry. No rashes. No abnormal appearing skin lesions  LYMPH NODES: Cervical, supraclavicular, and axillary nodes normal  Neurologic: Alert and oriented to place and time. Normal deep tendon reflexes. CN II-XII grossly intact.  Psychiatric: Mood and affect appropriate for situation.     Labs:     No results found for this visit on 01/15/20.    I attest that I, Bayard Hugger, personally documented this note while acting as scribe for Noralyn Pick, FNP.      Bayard Hugger, Scribe.  01/15/2020     The documentation recorded by the scribe accurately reflects the service I personally performed and the decisions made by me.    Noralyn Pick, FNP

## 2020-01-15 ENCOUNTER — Ambulatory Visit: Admit: 2020-01-15 | Discharge: 2020-01-16 | Payer: MEDICAID | Attending: Family | Primary: Family

## 2020-01-15 DIAGNOSIS — R635 Abnormal weight gain: Principal | ICD-10-CM

## 2020-01-15 DIAGNOSIS — Z124 Encounter for screening for malignant neoplasm of cervix: Principal | ICD-10-CM

## 2020-01-15 DIAGNOSIS — Z23 Encounter for immunization: Principal | ICD-10-CM

## 2020-01-15 DIAGNOSIS — Z111 Encounter for screening for respiratory tuberculosis: Principal | ICD-10-CM

## 2020-01-15 DIAGNOSIS — Z13220 Encounter for screening for lipoid disorders: Principal | ICD-10-CM

## 2020-01-15 DIAGNOSIS — Z0000 Encounter for general adult medical examination without abnormal findings: Principal | ICD-10-CM

## 2020-01-15 LAB — CBC
HEMATOCRIT: 46.4 % — ABNORMAL HIGH (ref 36.0–46.0)
HEMOGLOBIN: 14.8 g/dL (ref 12.0–16.0)
MEAN CORPUSCULAR HEMOGLOBIN: 28.6 pg (ref 26.0–34.0)
MEAN PLATELET VOLUME: 9.9 fL (ref 7.0–10.0)
PLATELET COUNT: 241 10*9/L (ref 150–440)
RED BLOOD CELL COUNT: 5.19 10*12/L (ref 4.00–5.20)
RED CELL DISTRIBUTION WIDTH: 13.2 % (ref 12.0–15.0)
WBC ADJUSTED: 9.7 10*9/L (ref 4.5–11.0)

## 2020-01-15 LAB — COMPREHENSIVE METABOLIC PANEL
ALBUMIN: 4.2 g/dL (ref 3.5–5.0)
ALKALINE PHOSPHATASE: 43 U/L (ref 38–126)
ALT (SGPT): 18 U/L (ref <35)
ANION GAP: 10 mmol/L (ref 7–15)
AST (SGOT): 20 U/L (ref 14–38)
BILIRUBIN TOTAL: 0.4 mg/dL (ref 0.0–1.2)
BLOOD UREA NITROGEN: 13 mg/dL (ref 7–21)
BUN / CREAT RATIO: 21
CALCIUM: 9.3 mg/dL (ref 8.5–10.2)
CO2: 27 mmol/L (ref 22.0–30.0)
CREATININE: 0.61 mg/dL (ref 0.60–1.00)
EGFR CKD-EPI AA FEMALE: >90 mL/min/{1.73_m2} (ref >=60)
EGFR CKD-EPI NON-AA FEMALE: >90 mL/min/{1.73_m2} (ref >=60)
GLUCOSE RANDOM: 93 mg/dL (ref 70–179)
POTASSIUM: 4.9 mmol/L (ref 3.5–5.0)
SODIUM: 139 mmol/L (ref 135–145)

## 2020-01-15 LAB — RED BLOOD CELL COUNT: Lab: 5.19

## 2020-01-15 LAB — CHOLESTEROL/HDL RATIO SCREEN: Lab: 1.9

## 2020-01-15 LAB — THYROID STIMULATING HORMONE: Thyrotropin:ACnc:Pt:Ser/Plas:Qn:: 0.419 — ABNORMAL LOW

## 2020-01-15 LAB — LIPID PANEL
CHOLESTEROL/HDL RATIO SCREEN: 1.9 (ref <5.0)
CHOLESTEROL: 120 mg/dL (ref 100–199)
HDL CHOLESTEROL: 64 mg/dL — ABNORMAL HIGH (ref 40–59)
LDL CHOLESTEROL CALCULATED: 49 mg/dL — ABNORMAL LOW (ref 60–99)
NON-HDL CHOLESTEROL: 56 mg/dL
TRIGLYCERIDES: 35 mg/dL (ref 1–149)

## 2020-01-15 LAB — BILIRUBIN TOTAL: Bilirubin:MCnc:Pt:Ser/Plas:Qn:: 0.4

## 2020-01-15 NOTE — Unmapped (Signed)
Ppd placed in left forearm.

## 2020-01-17 ENCOUNTER — Institutional Professional Consult (permissible substitution): Admit: 2020-01-17 | Discharge: 2020-01-18 | Payer: MEDICAID

## 2020-01-17 DIAGNOSIS — Z111 Encounter for screening for respiratory tuberculosis: Principal | ICD-10-CM

## 2020-01-17 LAB — FREE T4: Thyroxine.free:MCnc:Pt:Ser/Plas:Qn:: 1.32

## 2020-01-17 NOTE — Unmapped (Signed)
Addended by: Noralyn Pick on: 01/17/2020 04:58 PM     Modules accepted: Orders

## 2020-02-22 ENCOUNTER — Ambulatory Visit
Admission: EM | Admit: 2020-02-22 | Discharge: 2020-02-22 | Disposition: A | Discharge status: Home / Self Care | Payer: Medicaid Other | Attending: Emergency Medicine | Admitting: Emergency Medicine

## 2020-02-22 ENCOUNTER — Encounter: Payer: Self-pay | Admitting: Emergency Medicine

## 2020-02-22 ENCOUNTER — Other Ambulatory Visit: Payer: Self-pay

## 2020-02-22 DIAGNOSIS — N939 Abnormal uterine and vaginal bleeding, unspecified: Secondary | ICD-10-CM | POA: Diagnosis not present

## 2020-02-22 DIAGNOSIS — Z3202 Encounter for pregnancy test, result negative: Secondary | ICD-10-CM | POA: Insufficient documentation

## 2020-02-22 DIAGNOSIS — Z0189 Encounter for other specified special examinations: Secondary | ICD-10-CM | POA: Diagnosis present

## 2020-02-22 LAB — URINALYSIS, COMPLETE (UACMP) WITH MICROSCOPIC
Bilirubin Urine: NEGATIVE
Glucose, UA: NEGATIVE mg/dL
Ketones, ur: NEGATIVE mg/dL
Leukocytes,Ua: NEGATIVE
Nitrite: NEGATIVE
Protein, ur: NEGATIVE mg/dL
Specific Gravity, Urine: 1.02 (ref 1.005–1.030)
pH: 7 (ref 5.0–8.0)

## 2020-02-22 LAB — PREGNANCY, URINE: Preg Test, Ur: NEGATIVE

## 2020-02-22 NOTE — Discharge Instructions (Addendum)
It was very nice seeing you today in clinic. Thank you for entrusting me with your care.   Urine negative for infection. Pregnancy test negative as well. Spotting is related to recent initiation of oral birth control. Use Tylenol/IBU or Midol as needed for cramping. Follow up with PCP.   If your symptoms/condition worsens, please seek follow up care either here or in the ER. Please remember, our [facility] providers are "right here with you" when you need [place].   Again, it was my pleasure to take care of you today. Thank you for choosing our clinic. I hope that you start to feel better quickly.   Quentin Mulling, MSN, APRN, FNP-C, CEN Advanced Practice Provider [facility]

## 2020-02-22 NOTE — ED Triage Notes (Signed)
Patient states that she started having vaginal spotting today while at work.   Patient states that she is having some abdominal cramping.  Patient states that she started her menstrual period on April 14. Patient states that her period was normal.  Patient states that she started oral contraceptive pills on April 19.

## 2020-02-22 NOTE — ED Provider Notes (Signed)
Carla Vincent, [place]   Name: Carla Vincent DOB: May 14, 1991 MRN: [id number] CSN: [id number] PCP: Care, Carla Vincent Primary  Arrival date and time:  02/22/20 1251  Chief Complaint:  Vaginal Bleeding  NOTE: Prior to seeing the patient today, I have reviewed the triage nursing documentation and vital signs. Clinical staff has updated patient's PMH/PSHx, current medication list, and drug allergies/intolerances to ensure comprehensive history available to assist in medical decision making.   History:   HPI: Carla Vincent is a 29 y.o. female who presents today with complaints of vaginal bleeding that began with acute onset today while at work. Patient reports that she appreciated a very small amount of spotting associated with post-micturitional wiping. She notes urinary frequency; no dysuria, urgency, lower back pain, fever, or chills. She denies any external vaginal irritation that would account for her bleeding. She is experiencing some mild cramping in her abdomen. Patient's last menstrual period was 02/06/2020 (exact date). LMP was reported to be normal in terms of duration and flow. She notes that cramping and spotting are not symptoms that she typically experiences with her menstrual cycles, thus development of these symptoms following her cycle are concerning to her. Of note, patient started oral contraceptives on 02/11/2020. Patient presents for UPT citing that she and her husband had "a few accidents" prior to her starting on the BCPs.   Past Medical History:  Diagnosis Date  . Gallstones 03/2018  . History of kidney stones   . Left lower quadrant abdominal tenderness 03/15/2016  . Nausea & vomiting 05/09/2016    Past Surgical History:  Procedure Laterality Date  . ROBOTIC ASSISTED LAPAROSCOPIC CHOLECYSTECTOMY-MULTI SITE N/A 04/14/2018   Procedure: ROBOTIC ASSISTED LAPAROSCOPIC CHOLECYSTECTOMY-MULTI SITE;  Surgeon: Leafy Ro, MD;  Location: [facility] ORS;  Service: General;  Laterality: N/A;     Family History  Problem Relation Age of Onset  . Uterine cancer Maternal Aunt   . Breast cancer Paternal Aunt   . Diabetes Paternal Grandfather     Social History   Tobacco Use  . Smoking status: Never Smoker  . Smokeless tobacco: Never Used  Substance Use Topics  . Alcohol use: Yes    Comment: occasionally  . Drug use: Yes    Types: Marijuana    Patient Active Problem List   Diagnosis Date Noted  . Chronic cholecystitis   . Labor and delivery, indication for care 06/28/2016  . Nausea & vomiting 05/09/2016  . Left lower quadrant abdominal tenderness 03/15/2016    Home Medications:    No outpatient medications have been marked as taking for the 02/22/20 encounter [facility] Encounter).    Allergies:   Patient has no known allergies.  Review of Systems (ROS):  Review of systems NEGATIVE unless otherwise noted in narrative H&P section.   Vital Signs: Today's Vitals   02/22/20 1306 02/22/20 1309 02/22/20 1335  BP:  112/78   Pulse:  70   Resp:  14   Temp:  97.9 F (36.6 C)   TempSrc:  Oral   SpO2:  99%   Weight: 130 lb (59 kg)    Height: 5\' 2"  (1.575 m)    PainSc: 5   5     Physical Exam: Physical Exam  Constitutional: She is oriented to person, place, and time and well-developed, well-nourished, and in no distress.  HENT:  Head: Normocephalic and atraumatic.  Eyes: Pupils are equal, round, and reactive to light.  Cardiovascular: Normal rate, regular rhythm, normal heart sounds and intact distal pulses.  Pulmonary/Chest: Effort  normal and breath sounds normal.  Abdominal: Soft. Normal appearance and bowel sounds are normal. She exhibits no distension. There is no abdominal tenderness.  Genitourinary:    Genitourinary Comments: Exam deferred. No vaginal/pelvic pain. Bleeding described as "very minimal spotting". UPT (-) today.   Neurological: She is alert and oriented to person, place, and time. Gait normal.  Skin: Skin is warm and dry. No rash noted.  She is not diaphoretic.  Psychiatric: Memory, affect and judgment normal. Her mood appears anxious.  Nursing note and vitals reviewed.   Urgent Care Treatments / Results:   Orders Placed This Encounter  Procedures  . Urinalysis, Complete w Microscopic  . Pregnancy, urine    LABS: PLEASE NOTE: all labs that were ordered this encounter are listed, however only abnormal results are displayed. Labs Reviewed  URINALYSIS, COMPLETE (UACMP) WITH MICROSCOPIC - Abnormal; Notable for the following components:      Result Value   Hgb urine dipstick SMALL (*)    Bacteria, UA FEW (*)    All other components within normal limits  PREGNANCY, URINE    EKG: -None  RADIOLOGY: No results found.  PROCEDURES: Procedures  MEDICATIONS RECEIVED THIS VISIT: Medications - No data to display  PERTINENT CLINICAL COURSE NOTES/UPDATES:   Initial Impression / Assessment and Plan / Urgent Care Course:  Pertinent labs & imaging results that were available during my care of the patient were personally reviewed by me and considered in my medical decision making (see lab/imaging section of note for values and interpretations).  Carla Vincent is a 29 y.o. female who presents to [facility] today with complaints of Vaginal Bleeding  Patient is well appearing overall in clinic today. She does not appear to be in any acute distress. Presenting symptoms (see HPI) and exam as documented above. Urine (-) for infection. Urine hCG (-) for pregnancy. Discussed that symptoms that she is having are all normal and are commonly seen with the initiation of contraceptive therapy. Symptoms vary in severity as hormonal changes take place while taking the BCPs. Reassurance provided. May use APAP/IBU or Midol for abdominal cramping. Patient to monitor symptoms and follow up with PCP and/or GYN for further evaluation if symptoms worsen or there is further concern.   Discussed follow up with primary care physician in 1  week for re-evaluation. I have reviewed the follow up and strict return precautions for any new or worsening symptoms. Patient is aware of symptoms that would be deemed urgent/emergent, and would thus require further evaluation either here or in the emergency department. At the time of discharge, she verbalized understanding and consent with the discharge plan as it was reviewed with her. All questions were fielded by provider and/or clinic staff prior to patient discharge.    Final Clinical Impressions / Urgent Care Diagnoses:   Final diagnoses:  Vaginal bleeding  Encounter for pregnancy test, result negative  Encounter for urine test    New Prescriptions:  [place] Controlled Substance Registry consulted? Not Applicable  No orders of the defined types were placed in this encounter.   Recommended Follow up Care:  Patient encouraged to follow up with the following provider within the specified time frame, or sooner as dictated by the severity of her symptoms. As always, she was instructed that for any urgent/emergent care needs, she should seek care either here or in the emergency department for more immediate evaluation.  Follow-up Information    Care, Carla Vincent Primary In 1 week.   Specialty: Family Medicine  Why: General reassessment of symptoms if not improving Contact information: [address] [place] [address] [phone]         NOTE: This note was prepared using Dragon dictation software along with smaller phrase technology. Despite my best ability to proofread, there is the potential that transcriptional errors may still occur from this process, and are completely unintentional.    Verlee Monte, NP 02/22/20 2310

## 2020-03-20 ENCOUNTER — Other Ambulatory Visit: Admit: 2020-03-20 | Discharge: 2020-03-21 | Payer: MEDICAID

## 2020-03-20 DIAGNOSIS — E039 Hypothyroidism, unspecified: Principal | ICD-10-CM

## 2020-03-20 LAB — THYROID STIMULATING HORMONE: Thyrotropin:ACnc:Pt:Ser/Plas:Qn:: 0.549 — ABNORMAL LOW

## 2020-03-25 NOTE — Unmapped (Signed)
Referral pended if approve

## 2020-04-10 NOTE — Unmapped (Signed)
Attempted to contact patient 3 times on separate days regarding referral to Endo. Left VM's each time but patient never returned call to clinic. Keri needed patient  contacted to advise her that endocrinology will not schedule her without a recent thyroid ultrasound. If she has had one in the past 6 months will need to forward a copy to ENDO. If she has not had one in the past 6 months Keri will need to order one.

## 2020-09-15 NOTE — Unmapped (Signed)
Has gotten her oral contraception pills, Loestrin 21, on line after a phone visit with a provider.  This was something her insurance provided to her but now they no longer participates so she needs OCP sent in for her on line.  Lorina Rabon has never prescribed.  Diorrella doesn't want it sent to any [place] pharmacy.   [place].  Checked with Eve and patient will need to go to the [facility] or see a provider in that state.

## 2020-10-25 DIAGNOSIS — Z419 Encounter for procedure for purposes other than remedying health state, unspecified: Secondary | ICD-10-CM | POA: Diagnosis not present

## 2020-11-25 DIAGNOSIS — Z419 Encounter for procedure for purposes other than remedying health state, unspecified: Secondary | ICD-10-CM | POA: Diagnosis not present

## 2020-12-23 DIAGNOSIS — Z419 Encounter for procedure for purposes other than remedying health state, unspecified: Secondary | ICD-10-CM | POA: Diagnosis not present

## 2021-01-23 DIAGNOSIS — Z419 Encounter for procedure for purposes other than remedying health state, unspecified: Secondary | ICD-10-CM | POA: Diagnosis not present

## 2021-02-22 DIAGNOSIS — Z419 Encounter for procedure for purposes other than remedying health state, unspecified: Secondary | ICD-10-CM | POA: Diagnosis not present

## 2021-03-25 DIAGNOSIS — Z419 Encounter for procedure for purposes other than remedying health state, unspecified: Secondary | ICD-10-CM | POA: Diagnosis not present

## 2021-04-24 DIAGNOSIS — Z419 Encounter for procedure for purposes other than remedying health state, unspecified: Secondary | ICD-10-CM | POA: Diagnosis not present

## 2021-05-25 DIAGNOSIS — Z419 Encounter for procedure for purposes other than remedying health state, unspecified: Secondary | ICD-10-CM | POA: Diagnosis not present

## 2021-06-25 DIAGNOSIS — Z419 Encounter for procedure for purposes other than remedying health state, unspecified: Secondary | ICD-10-CM | POA: Diagnosis not present

## 2021-07-25 DIAGNOSIS — Z419 Encounter for procedure for purposes other than remedying health state, unspecified: Secondary | ICD-10-CM | POA: Diagnosis not present

## 2021-08-25 DIAGNOSIS — Z419 Encounter for procedure for purposes other than remedying health state, unspecified: Secondary | ICD-10-CM | POA: Diagnosis not present

## 2021-09-24 DIAGNOSIS — Z419 Encounter for procedure for purposes other than remedying health state, unspecified: Secondary | ICD-10-CM | POA: Diagnosis not present

## 2021-10-14 ENCOUNTER — Ambulatory Visit: Admit: 2021-10-14 | Payer: PRIVATE HEALTH INSURANCE | Attending: Family | Primary: Family

## 2021-10-25 DIAGNOSIS — Z419 Encounter for procedure for purposes other than remedying health state, unspecified: Secondary | ICD-10-CM | POA: Diagnosis not present

## 2021-10-25 NOTE — L&D Delivery Note (Signed)
Delivery Note At 2:14 AM a viable and female   child was delivered via Vaginal, Spontaneous (Presentation:  VTX     ).  APGAR: 9/9 , ; weight  .   Placenta status: Spontaneous, Intact.Delivered shortly after   Cord: 3 vessels with the following complications: None.  Cord pH: n/a  Anesthesia: Epidural Episiotomy: None Lacerations: None Suture Repair:  n/a Est. Blood Loss (mL):  100cc Mother pushed 1 set of pushes with controlled delivery over intact perineum . Vigorous female dried on mother's abdomen for 60 sec with delayed cord clamping  No complications Placenta intact , no lacerations noted . Good uterine tone with Iv Pitocin   Mom to postpartum.  Baby to Couplet care / Skin to Skin.  Carla Vincent 09/24/2022, 2:24 AM

## 2021-11-25 DIAGNOSIS — Z419 Encounter for procedure for purposes other than remedying health state, unspecified: Secondary | ICD-10-CM | POA: Diagnosis not present

## 2021-12-23 DIAGNOSIS — Z419 Encounter for procedure for purposes other than remedying health state, unspecified: Secondary | ICD-10-CM | POA: Diagnosis not present

## 2022-01-23 DIAGNOSIS — Z419 Encounter for procedure for purposes other than remedying health state, unspecified: Secondary | ICD-10-CM | POA: Diagnosis not present

## 2022-02-19 DIAGNOSIS — N912 Amenorrhea, unspecified: Secondary | ICD-10-CM | POA: Diagnosis not present

## 2022-02-22 DIAGNOSIS — Z419 Encounter for procedure for purposes other than remedying health state, unspecified: Secondary | ICD-10-CM | POA: Diagnosis not present

## 2022-03-16 DIAGNOSIS — Z3490 Encounter for supervision of normal pregnancy, unspecified, unspecified trimester: Secondary | ICD-10-CM | POA: Insufficient documentation

## 2022-03-17 DIAGNOSIS — Z3481 Encounter for supervision of other normal pregnancy, first trimester: Secondary | ICD-10-CM | POA: Diagnosis not present

## 2022-03-25 DIAGNOSIS — Z419 Encounter for procedure for purposes other than remedying health state, unspecified: Secondary | ICD-10-CM | POA: Diagnosis not present

## 2022-04-14 DIAGNOSIS — Z3482 Encounter for supervision of other normal pregnancy, second trimester: Secondary | ICD-10-CM | POA: Diagnosis not present

## 2022-04-24 DIAGNOSIS — Z419 Encounter for procedure for purposes other than remedying health state, unspecified: Secondary | ICD-10-CM | POA: Diagnosis not present

## 2022-05-11 DIAGNOSIS — Z3482 Encounter for supervision of other normal pregnancy, second trimester: Secondary | ICD-10-CM | POA: Diagnosis not present

## 2022-05-12 ENCOUNTER — Ambulatory Visit
Admission: EM | Admit: 2022-05-12 | Discharge: 2022-05-12 | Disposition: A | Discharge status: Home / Self Care | Payer: Medicaid Other | Attending: Emergency Medicine | Admitting: Emergency Medicine

## 2022-05-12 DIAGNOSIS — B084 Enteroviral vesicular stomatitis with exanthem: Secondary | ICD-10-CM | POA: Insufficient documentation

## 2022-05-12 LAB — GROUP A STREP BY PCR: Group A Strep by PCR: NOT DETECTED

## 2022-05-12 NOTE — Discharge Instructions (Addendum)
Your test today for strep was negative.  I believe this is hand-foot-and-mouth disease.  Usually counter Tylenol as needed for fever.  Follow-up with your OB/GYN regarding your diagnosis of hand-foot-and-mouth and let her guide you with any additional therapies.  You can also return for reevaluation for new or worsening symptoms.

## 2022-05-12 NOTE — ED Provider Notes (Signed)
[facility]    CSN: [id number] Arrival date & time: 05/12/22  1039      History   Chief Complaint Chief Complaint  Patient presents with   Blister    Hand foot mouth disease - Entered by patient    HPI Carla Vincent is a 31 y.o. female.   HPI  31 year old female here for evaluation of rash.  Patient reports that she developed itchy red rash on her hands yesterday and she noted some lesions on her chin this morning.  She also reports that her feet have started to become itchy but she has not noticed any lesions on her feet.  The patient also endorses a very mildly sore throat but reports it is mainly there when swallowing or eating food.  She is currently [redacted] weeks pregnant and on the advice of her OB/GYN she came in here to rule out other sources of the rash.  She denies any fever, runny nose, nasal congestion, cough, shortness of breath, or GI complaints.  She is unaware of any other sick contacts though she does report that her older daughter had a fever just after 4 July but did not develop any other cold symptoms or rashes.  The lesion on her chin she reports it did drain some clear fluid this morning.  Past Medical History:  Diagnosis Date   Gallstones 03/2018   History of kidney stones    Left lower quadrant abdominal tenderness 03/15/2016   Nausea & vomiting 05/09/2016    Patient Active Problem List   Diagnosis Date Noted   Chronic cholecystitis    Labor and delivery, indication for care 06/28/2016   Nausea & vomiting 05/09/2016   Left lower quadrant abdominal tenderness 03/15/2016    Past Surgical History:  Procedure Laterality Date   ROBOTIC ASSISTED LAPAROSCOPIC CHOLECYSTECTOMY-MULTI SITE N/A 04/14/2018   Procedure: ROBOTIC ASSISTED LAPAROSCOPIC CHOLECYSTECTOMY-MULTI SITE;  Surgeon: Leafy Ro, MD;  Location: [facility] ORS;  Service: General;  Laterality: N/A;    OB History     Gravida  2   Para  1   Term  1   Preterm      AB      Living   1      SAB      IAB      Ectopic      Multiple  0   Live Births  1            Home Medications    Prior to Admission medications   Medication Sig Start Date End Date Taking? Authorizing Provider  albuterol (PROVENTIL HFA;VENTOLIN HFA) 108 (90 Base) MCG/ACT inhaler Inhale 1-2 puffs into the lungs every 6 (six) hours as needed for wheezing or shortness of breath. Use with spacer 07/04/18   Lutricia Feil, PA-C  fluticasone (FLONASE) 50 MCG/ACT nasal spray Place 2 sprays into both nostrils daily. 07/04/18   Lutricia Feil, PA-C    Family History Family History  Problem Relation Age of Onset   Uterine cancer Maternal Aunt    Breast cancer Paternal Aunt    Diabetes Paternal Grandfather     Social History Social History   Tobacco Use   Smoking status: Never   Smokeless tobacco: Never  Vaping Use   Vaping Use: Never used  Substance Use Topics   Alcohol use: Yes    Comment: occasionally   Drug use: Yes    Types: Marijuana     Allergies   Patient has no known  allergies.   Review of Systems Review of Systems  Constitutional:  Negative for fever.  HENT:  Negative for congestion, ear pain, rhinorrhea and sore throat.   Respiratory:  Negative for cough, shortness of breath and wheezing.   Gastrointestinal:  Negative for diarrhea, nausea and vomiting.  Skin:  Positive for rash.  Hematological: Negative.   Psychiatric/Behavioral: Negative.       Physical Exam Triage Vital Signs ED Triage Vitals  Enc Vitals Group     BP 05/12/22 1159 129/67     Pulse Rate 05/12/22 1159 90     Resp 05/12/22 1159 18     Temp 05/12/22 1159 98.3 F (36.8 C)     Temp Source 05/12/22 1159 Oral     SpO2 05/12/22 1159 100 %     Weight --      Height --      Head Circumference --      Peak Flow --      Pain Score 05/12/22 1200 0     Pain Loc --      Pain Edu? --      Excl. in GC? --    No data found.  Updated Vital Signs BP 129/67 (BP Location: Left Arm)   Pulse  90   Temp 98.3 F (36.8 C) (Oral)   Resp 18   SpO2 100%   Visual Acuity Right Eye Distance:   Left Eye Distance:   Bilateral Distance:    Right Eye Near:   Left Eye Near:    Bilateral Near:     Physical Exam Vitals and nursing note reviewed.  Constitutional:      Appearance: Normal appearance. She is not ill-appearing.  HENT:     Head: Normocephalic and atraumatic.     Right Ear: Tympanic membrane, ear canal and external ear normal. There is no impacted cerumen.     Left Ear: Tympanic membrane, ear canal and external ear normal. There is no impacted cerumen.     Nose: Congestion and rhinorrhea present.     Mouth/Throat:     Mouth: Mucous membranes are moist.     Pharynx: Oropharynx is clear. Posterior oropharyngeal erythema present. No oropharyngeal exudate.  Cardiovascular:     Rate and Rhythm: Normal rate and regular rhythm.     Pulses: Normal pulses.     Heart sounds: Normal heart sounds. No murmur heard.    No friction rub. No gallop.  Pulmonary:     Effort: Pulmonary effort is normal.     Breath sounds: Normal breath sounds. No wheezing, rhonchi or rales.  Musculoskeletal:     Cervical back: Normal range of motion and neck supple.  Lymphadenopathy:     Cervical: No cervical adenopathy.  Skin:    General: Skin is warm and dry.     Capillary Refill: Capillary refill takes less than 2 seconds.     Findings: Rash present. No erythema.  Neurological:     General: No focal deficit present.     Mental Status: She is alert and oriented to person, place, and time.  Psychiatric:        Mood and Affect: Mood normal.        Behavior: Behavior normal.        Thought Content: Thought content normal.        Judgment: Judgment normal.      UC Treatments / Results  Labs (all labs ordered are listed, but only abnormal results are displayed) Labs Reviewed  GROUP  A STREP BY PCR    EKG   Radiology No results found.  Procedures Procedures (including critical care  time)  Medications Ordered in UC Medications - No data to display  Initial Impression / Assessment and Plan / UC Course  I have reviewed the triage vital signs and the nursing notes.  Pertinent labs & imaging results that were available during my care of the patient were reviewed by me and considered in my medical decision making (see chart for details).  Patient is a very pleasant, nontoxic-appearing 31 year old female here for evaluation of a rash on her mouth and on her hands that started last night.  She also endorses a very mild sore throat that occurs mostly with swallowing food or fluids.  No other upper or lower respiratory symptoms.  No GI symptoms.  No known sick contacts.  On exam patient has pearly-gray tympanic membranes bilaterally with normal light reflex and clear external auditory canals.  Nasal mucosa is mildly erythematous and edematous with clear discharge in both nares.  Oropharyngeal exam reveals bilateral erythematous tonsillar pillars and soft palate.  Posterior oropharynx has mild erythema.  There are 3 lesions on the patient's chin with the largest lesion having a small scabbed area.  No honey crust or surrounding erythema noted.  No cervical lymphadenopathy appreciable exam.  Cardiopulmonary exam reveals S1-S2 heart sounds with regular rate and rhythm and lung sounds are clear to auscultation all fields.  Patient does have erythematous maculopapular lesions on the palms of her hand and between her fingers.  This may possibly be hand-foot-and-mouth.  I will swab the patient for strep to rule out strep pharyngitis as a cause of the patient's sore throat symptoms.  Strep PCR is negative.  I will discharge patient with a diagnosis of hand-foot-and-mouth.  She should contact her OB/GYN for any worsening of her symptoms.   Final Clinical Impressions(s) / UC Diagnoses   Final diagnoses:  Hand, foot and mouth disease (HFMD)     Discharge Instructions      Your test today  for strep was negative.  I believe this is hand-foot-and-mouth disease.  Usually counter Tylenol as needed for fever.  Follow-up with your OB/GYN regarding your diagnosis of hand-foot-and-mouth and let her guide you with any additional therapies.  You can also return for reevaluation for new or worsening symptoms.     ED Prescriptions   None    PDMP not reviewed this encounter.   Margarette Canada, NP 05/12/22 1433

## 2022-05-12 NOTE — ED Triage Notes (Signed)
Pt present rash/ blister on her hands and face area. Pt states symptoms started yesterday morning when her hands was very itchy.

## 2022-05-21 ENCOUNTER — Ambulatory Visit
Admission: EM | Admit: 2022-05-21 | Discharge: 2022-05-21 | Disposition: A | Discharge status: Home / Self Care | Payer: Medicaid Other | Attending: Physician Assistant | Admitting: Physician Assistant

## 2022-05-21 DIAGNOSIS — R059 Cough, unspecified: Secondary | ICD-10-CM | POA: Diagnosis not present

## 2022-05-21 DIAGNOSIS — J45909 Unspecified asthma, uncomplicated: Secondary | ICD-10-CM | POA: Diagnosis not present

## 2022-05-21 DIAGNOSIS — J309 Allergic rhinitis, unspecified: Secondary | ICD-10-CM | POA: Diagnosis not present

## 2022-05-21 MED ORDER — FLUTICASONE PROPIONATE 50 MCG/ACT NA SUSP: 1.0000 | Freq: Every day | NASAL | 2 refills | Status: AC

## 2022-05-21 MED ORDER — ALBUTEROL SULFATE HFA 108 (90 BASE) MCG/ACT IN AERS: 1.0000 | INHALATION_SPRAY | Freq: Four times a day (QID) | RESPIRATORY_TRACT | 0 refills | Status: AC | PRN

## 2022-05-21 NOTE — ED Triage Notes (Signed)
Patient present to UC for cough and congestion started Monday.   She thinks she was wheezing last night.   Patient denies any fever.Marland Kitchen

## 2022-05-21 NOTE — ED Provider Notes (Signed)
[facility]    CSN: [id number] Arrival date & time: 05/21/22  0865      History   Chief Complaint Chief Complaint  Patient presents with   Cough   Nasal Congestion    HPI Carla Vincent is a 31 y.o. female.   Patient is a 31 year old female approximate [redacted] weeks gestation presents with stuffy nose cough since Monday and states last night she had a bad headache and thought she might be wheezing.  She does report a history of bronchitis.  Patient was seen here on July 19 given diagnosis of hand-foot-and-mouth disease.  She states those lesions have resolved.  She took some Tylenol last night for her headache.  She denies any allergies to medications.    Past Medical History:  Diagnosis Date   Gallstones 03/2018   History of kidney stones    Left lower quadrant abdominal tenderness 03/15/2016   Nausea & vomiting 05/09/2016    Patient Active Problem List   Diagnosis Date Noted   Chronic cholecystitis    Labor and delivery, indication for care 06/28/2016   Nausea & vomiting 05/09/2016   Left lower quadrant abdominal tenderness 03/15/2016    Past Surgical History:  Procedure Laterality Date   ROBOTIC ASSISTED LAPAROSCOPIC CHOLECYSTECTOMY-MULTI SITE N/A 04/14/2018   Procedure: ROBOTIC ASSISTED LAPAROSCOPIC CHOLECYSTECTOMY-MULTI SITE;  Surgeon: Leafy Ro, MD;  Location: [facility] ORS;  Service: General;  Laterality: N/A;    OB History     Gravida  2   Para  1   Term  1   Preterm      AB      Living  1      SAB      IAB      Ectopic      Multiple  0   Live Births  1            Home Medications    Prior to Admission medications   Medication Sig Start Date End Date Taking? Authorizing Provider  albuterol (VENTOLIN HFA) 108 (90 Base) MCG/ACT inhaler Inhale 1-2 puffs into the lungs every 6 (six) hours as needed for wheezing or shortness of breath. 05/21/22  Yes Candis Schatz, PA-C  fluticasone (FLONASE) 50 MCG/ACT nasal spray Place 1  spray into both nostrils daily. 05/21/22  Yes Candis Schatz, PA-C    Family History Family History  Problem Relation Age of Onset   Uterine cancer Maternal Aunt    Breast cancer Paternal Aunt    Diabetes Paternal Grandfather     Social History Social History   Tobacco Use   Smoking status: Never   Smokeless tobacco: Never  Vaping Use   Vaping Use: Never used  Substance Use Topics   Alcohol use: Yes    Comment: occasionally   Drug use: Yes    Types: Marijuana     Allergies   Patient has no known allergies.   Review of Systems Review of Systems as above in HPI.  Other systems reviewed and found to be negative   Physical Exam Triage Vital Signs ED Triage Vitals  Enc Vitals Group     BP 05/21/22 0955 105/74     Pulse Rate 05/21/22 0955 86     Resp 05/21/22 0955 18     Temp 05/21/22 0955 98.2 F (36.8 C)     Temp Source 05/21/22 0955 Oral     SpO2 05/21/22 0955 100 %     Weight 05/21/22 0953 152 lb (68.9  kg)     Height 05/21/22 0953 5\' 2"  (1.575 m)     Head Circumference --      Peak Flow --      Pain Score 05/21/22 0953 0     Pain Loc --      Pain Edu? --      Excl. in GC? --    No data found.  Updated Vital Signs BP 105/74 (BP Location: Left Arm)   Pulse 86   Temp 98.2 F (36.8 C) (Oral)   Resp 18   Ht 5\' 2"  (1.575 m)   Wt 152 lb (68.9 kg)   SpO2 100%   BMI 27.80 kg/m    Physical Exam Constitutional:      Appearance: Normal appearance.  HENT:     Head: Normocephalic and atraumatic.     Right Ear: Tympanic membrane normal.     Left Ear: Tympanic membrane normal.     Nose: Congestion present.     Mouth/Throat:     Mouth: Mucous membranes are moist.     Comments: Minimal OP erythema, clear post nasal drainage  Cardiovascular:     Rate and Rhythm: Normal rate and regular rhythm.  Pulmonary:     Effort: Pulmonary effort is normal.     Comments: Cough with deep breath Abdominal:     Comments: WNL for pregnancy  Musculoskeletal:      Cervical back: Normal range of motion.  Neurological:     General: No focal deficit present.     Mental Status: She is alert and oriented to person, place, and time.      UC Treatments / Results  Labs (all labs ordered are listed, but only abnormal results are displayed) Labs Reviewed - No data to display  EKG   Radiology No results found.  Procedures Procedures (including critical care time)  Medications Ordered in UC Medications - No data to display  Initial Impression / Assessment and Plan / UC Course  I have reviewed the triage vital signs and the nursing notes.  Pertinent labs & imaging results that were available during my care of the patient were reviewed by me and considered in my medical decision making (see chart for details).     Patient [redacted] weeks gestation.  Reports stuffy nose and cough since Monday.  Frequent cough on exam.  Reporting headache last night and thought the possible wheezing.  Previous hand-foot-and-mouth disease resolved per patient.  We will give her albuterol inhaler for cough and reactive airway.  Recommend over-the-counter medications like allergy medications and Flonase for her stuffy nose and drainage.  Have her follow-up with her OB doctor should symptoms worsen. Final Clinical Impressions(s) / UC Diagnoses   Final diagnoses:  Allergic rhinitis, unspecified seasonality, unspecified trigger  Reactive airway disease without complication, unspecified asthma severity, unspecified whether persistent  Cough, unspecified type     Discharge Instructions      -Albuterol inhaler: 1 to 2 puffs every 6 hours as needed for cough or wheezing -Flonase: 1 spray into each nostril daily, recommend in the morning.  Recommend other over-the-counter allergy medication (Allegra, Zyrtec, Claritin) at night. -Can use other over-the-counter medications for symptoms -If no improvement follow-up with your OB.     ED Prescriptions     Medication Sig  Dispense Auth. Provider   albuterol (VENTOLIN HFA) 108 (90 Base) MCG/ACT inhaler Inhale 1-2 puffs into the lungs every 6 (six) hours as needed for wheezing or shortness of breath. 1 each  D, PA-C   fluticasone (FLONASE) 50 MCG/ACT nasal spray Place 1 spray into both nostrils daily. 16 g Luvenia Redden, PA-C      PDMP not reviewed this encounter.   Luvenia Redden, PA-C 05/21/22 1013

## 2022-05-21 NOTE — Discharge Instructions (Addendum)
-  Albuterol inhaler: 1 to 2 puffs every 6 hours as needed for cough or wheezing -Flonase: 1 spray into each nostril daily, recommend in the morning.  Recommend other over-the-counter allergy medication (Allegra, Zyrtec, Claritin) at night. -Can use other over-the-counter medications for symptoms -If no improvement follow-up with your OB.

## 2022-05-25 ENCOUNTER — Ambulatory Visit
Admission: EM | Admit: 2022-05-25 | Discharge: 2022-05-25 | Disposition: A | Discharge status: Home / Self Care | Payer: Medicaid Other | Attending: Family Medicine | Admitting: Family Medicine

## 2022-05-25 DIAGNOSIS — Z3A22 22 weeks gestation of pregnancy: Secondary | ICD-10-CM

## 2022-05-25 DIAGNOSIS — O26892 Other specified pregnancy related conditions, second trimester: Secondary | ICD-10-CM | POA: Diagnosis not present

## 2022-05-25 DIAGNOSIS — H6502 Acute serous otitis media, left ear: Secondary | ICD-10-CM | POA: Diagnosis not present

## 2022-05-25 DIAGNOSIS — J209 Acute bronchitis, unspecified: Secondary | ICD-10-CM | POA: Diagnosis not present

## 2022-05-25 DIAGNOSIS — Z419 Encounter for procedure for purposes other than remedying health state, unspecified: Secondary | ICD-10-CM | POA: Diagnosis not present

## 2022-05-25 DIAGNOSIS — R051 Acute cough: Secondary | ICD-10-CM

## 2022-05-25 DIAGNOSIS — O99512 Diseases of the respiratory system complicating pregnancy, second trimester: Secondary | ICD-10-CM | POA: Diagnosis not present

## 2022-05-25 MED ORDER — CEFDINIR 300 MG PO CAPS: 300.0000 mg | ORAL_CAPSULE | Freq: Two times a day (BID) | ORAL | 0 refills | Status: AC

## 2022-05-25 NOTE — ED Triage Notes (Signed)
Patient to UC with complaints of nasal congestion and left sided ear ache. Reports that she was seen on 7/28, has been taking flonase and mucinex with no relief in symptoms. Reports ear ache started yesterday.

## 2022-05-25 NOTE — Discharge Instructions (Addendum)
-  You have a virus.  You have a chest cold or bronchitis.  You also have fluid behind your eardrum.  This virus is going to takes more time to run its course.  I have sent antibiotics just in case you are developing a secondary bacterial infection. - Continue Robitussin-DM, Flonase and the inhaler if needed.  Also add an antihistamine such as Allegra or Claritin.  Increase rest and fluids. - Go to emergency department if you have a fever, pain in your chest or difficulty breathing.

## 2022-05-25 NOTE — ED Provider Notes (Signed)
[facility]    CSN: [id number] Arrival date & time: 05/25/22  1148      History   Chief Complaint Chief Complaint  Patient presents with   Nasal Congestion    HPI Carla Vincent is a 31 y.o. female presenting for 8-day history of nasal congestion, sinus pressure, cough that is productive.  Patient reports left-sided ear pain that started yesterday.  She was seen on 7/28 and thought to have allergies and advised to try Flonase and albuterol.  She contacted her OB/GYN and they advised her she could take Robitussin-DM.  She reports that she is feeling worse and not better and concerned about potential ear infection.  She has not had any fevers or breathing difficulty.  She is [redacted] weeks pregnant. No history of asthma or lung disease.   HPI  Past Medical History:  Diagnosis Date   Gallstones 03/2018   History of kidney stones    Left lower quadrant abdominal tenderness 03/15/2016   Nausea & vomiting 05/09/2016    Patient Active Problem List   Diagnosis Date Noted   Chronic cholecystitis    Labor and delivery, indication for care 06/28/2016   Nausea & vomiting 05/09/2016   Left lower quadrant abdominal tenderness 03/15/2016    Past Surgical History:  Procedure Laterality Date   ROBOTIC ASSISTED LAPAROSCOPIC CHOLECYSTECTOMY-MULTI SITE N/A 04/14/2018   Procedure: ROBOTIC ASSISTED LAPAROSCOPIC CHOLECYSTECTOMY-MULTI SITE;  Surgeon: Leafy Ro, MD;  Location: [facility] ORS;  Service: General;  Laterality: N/A;    OB History     Gravida  2   Para  1   Term  1   Preterm      AB      Living  1      SAB      IAB      Ectopic      Multiple  0   Live Births  1            Home Medications    Prior to Admission medications   Medication Sig Start Date End Date Taking? Authorizing Provider  cefdinir (OMNICEF) 300 MG capsule Take 1 capsule (300 mg total) by mouth 2 (two) times daily for 7 days. 05/25/22 06/01/22 Yes Shirlee Latch, PA-C  albuterol  (VENTOLIN HFA) 108 (90 Base) MCG/ACT inhaler Inhale 1-2 puffs into the lungs every 6 (six) hours as needed for wheezing or shortness of breath. 05/21/22   Candis Schatz, PA-C  fluticasone (FLONASE) 50 MCG/ACT nasal spray Place 1 spray into both nostrils daily. 05/21/22   Candis Schatz, PA-C    Family History Family History  Problem Relation Age of Onset   Uterine cancer Maternal Aunt    Breast cancer Paternal Aunt    Diabetes Paternal Grandfather     Social History Social History   Tobacco Use   Smoking status: Never   Smokeless tobacco: Never  Vaping Use   Vaping Use: Never used  Substance Use Topics   Alcohol use: Yes    Comment: occasionally   Drug use: Not Currently    Types: Marijuana     Allergies   Patient has no known allergies.   Review of Systems Review of Systems  Constitutional:  Positive for fatigue. Negative for chills, diaphoresis and fever.  HENT:  Positive for congestion, ear pain, rhinorrhea, sinus pressure and sinus pain. Negative for sore throat.   Respiratory:  Positive for cough. Negative for shortness of breath.   Gastrointestinal:  Negative for abdominal  pain, nausea and vomiting.  Musculoskeletal:  Negative for arthralgias and myalgias.  Skin:  Negative for rash.  Neurological:  Positive for headaches. Negative for weakness.  Hematological:  Negative for adenopathy.     Physical Exam Triage Vital Signs ED Triage Vitals  Enc Vitals Group     BP 05/25/22 1208 123/72     Pulse Rate 05/25/22 1208 (!) 102     Resp 05/25/22 1208 18     Temp 05/25/22 1208 98.9 F (37.2 C)     Temp Source 05/25/22 1208 Oral     SpO2 05/25/22 1208 99 %     Weight 05/25/22 1207 151 lb 14.4 oz (68.9 kg)     Height 05/25/22 1207 5\' 2"  (1.575 m)     Head Circumference --      Peak Flow --      Pain Score 05/25/22 1206 8     Pain Loc --      Pain Edu? --      Excl. in GC? --    No data found.  Updated Vital Signs BP 123/72   Pulse (!) 102   Temp  98.9 F (37.2 C) (Oral)   Resp 18   Ht 5\' 2"  (1.575 m)   Wt 151 lb 14.4 oz (68.9 kg)   SpO2 99%   BMI 27.78 kg/m   Physical Exam Vitals and nursing note reviewed.  Constitutional:      General: She is not in acute distress.    Appearance: Normal appearance. She is ill-appearing. She is not toxic-appearing.  HENT:     Head: Normocephalic and atraumatic.     Right Ear: Ear canal and external ear normal.     Left Ear: Ear canal and external ear normal. A middle ear effusion is present. Tympanic membrane is injected.     Nose: Congestion present.     Mouth/Throat:     Mouth: Mucous membranes are moist.     Pharynx: Oropharynx is clear.  Eyes:     General: No scleral icterus.       Right eye: No discharge.        Left eye: No discharge.     Conjunctiva/sclera: Conjunctivae normal.  Cardiovascular:     Rate and Rhythm: Regular rhythm. Tachycardia present.     Heart sounds: Normal heart sounds.  Pulmonary:     Effort: Pulmonary effort is normal. No respiratory distress.     Breath sounds: Wheezing (few scattered wheezes RML, RLL) present.  Musculoskeletal:     Cervical back: Neck supple.  Skin:    General: Skin is dry.  Neurological:     General: No focal deficit present.     Mental Status: She is alert. Mental status is at baseline.     Motor: No weakness.     Gait: Gait normal.  Psychiatric:        Mood and Affect: Mood normal.        Behavior: Behavior normal.        Thought Content: Thought content normal.      UC Treatments / Results  Labs (all labs ordered are listed, but only abnormal results are displayed) Labs Reviewed - No data to display  EKG   Radiology No results found.  Procedures Procedures (including critical care time)  Medications Ordered in UC Medications - No data to display  Initial Impression / Assessment and Plan / UC Course  I have reviewed the triage vital signs and the nursing notes.  Pertinent labs & imaging results that were  available during my care of the patient were reviewed by me and considered in my medical decision making (see chart for details).  31 year old female who is [redacted] weeks pregnant presents for 8-day history of nasal congestion and cough.  Onset of left ear pain yesterday.  Taking Robitussin-DM, Flonase and using albuterol without improvement in symptoms.  No associated fevers or breathing trouble.  Vitals are stable.  She is mildly ill-appearing but nontoxic.  On exam she does have nasal congestion, significant clear effusion of the left TM without erythema or bulging, few scattered wheezes throughout the right middle lobe and right lower lobe.  Suspect patient has viral bronchitis and acute serous otitis media.  However we will cover her with antibiotics given the wheezing and worsening symptoms.  Sent cefdinir to pharmacy.  Advised her to continue the other medications that she has been taking and also start antihistamines.  Reviewed return and ER precautions.   Final Clinical Impressions(s) / UC Diagnoses   Final diagnoses:  Acute bronchitis, unspecified organism  Acute cough  Acute serous otitis media of left ear, recurrence not specified  [redacted] weeks gestation of pregnancy     Discharge Instructions      -You have a virus.  You have a chest cold or bronchitis.  You also have fluid behind your eardrum.  This virus is going to takes more time to run its course.  I have sent antibiotics just in case you are developing a secondary bacterial infection. - Continue Robitussin-DM, Flonase and the inhaler if needed.  Also add an antihistamine such as Allegra or Claritin.  Increase rest and fluids. - Go to emergency department if you have a fever, pain in your chest or difficulty breathing.     ED Prescriptions     Medication Sig Dispense Auth. Provider   cefdinir (OMNICEF) 300 MG capsule Take 1 capsule (300 mg total) by mouth 2 (two) times daily for 7 days. 14 capsule Shirlee Latch, PA-C       PDMP not reviewed this encounter.   Shirlee Latch, PA-C 05/25/22 1237

## 2022-06-10 DIAGNOSIS — N898 Other specified noninflammatory disorders of vagina: Secondary | ICD-10-CM | POA: Diagnosis not present

## 2022-06-25 DIAGNOSIS — Z419 Encounter for procedure for purposes other than remedying health state, unspecified: Secondary | ICD-10-CM | POA: Diagnosis not present

## 2022-07-09 DIAGNOSIS — Z3481 Encounter for supervision of other normal pregnancy, first trimester: Secondary | ICD-10-CM | POA: Diagnosis not present

## 2022-07-09 DIAGNOSIS — Z23 Encounter for immunization: Secondary | ICD-10-CM | POA: Diagnosis not present

## 2022-07-12 DIAGNOSIS — R7309 Other abnormal glucose: Secondary | ICD-10-CM | POA: Diagnosis not present

## 2022-07-25 DIAGNOSIS — Z419 Encounter for procedure for purposes other than remedying health state, unspecified: Secondary | ICD-10-CM | POA: Diagnosis not present

## 2022-07-28 DIAGNOSIS — O99013 Anemia complicating pregnancy, third trimester: Secondary | ICD-10-CM

## 2022-07-28 HISTORY — DX: Anemia complicating pregnancy, third trimester: O99.013

## 2022-08-25 DIAGNOSIS — Z23 Encounter for immunization: Secondary | ICD-10-CM | POA: Diagnosis not present

## 2022-08-25 DIAGNOSIS — Z419 Encounter for procedure for purposes other than remedying health state, unspecified: Secondary | ICD-10-CM | POA: Diagnosis not present

## 2022-08-30 DIAGNOSIS — Z3483 Encounter for supervision of other normal pregnancy, third trimester: Secondary | ICD-10-CM | POA: Diagnosis not present

## 2022-08-30 DIAGNOSIS — Z3482 Encounter for supervision of other normal pregnancy, second trimester: Secondary | ICD-10-CM | POA: Diagnosis not present

## 2022-09-07 DIAGNOSIS — D508 Other iron deficiency anemias: Secondary | ICD-10-CM | POA: Diagnosis not present

## 2022-09-07 DIAGNOSIS — Z3483 Encounter for supervision of other normal pregnancy, third trimester: Secondary | ICD-10-CM | POA: Diagnosis not present

## 2022-09-07 DIAGNOSIS — O99013 Anemia complicating pregnancy, third trimester: Secondary | ICD-10-CM | POA: Diagnosis not present

## 2022-09-21 NOTE — Progress Notes (Signed)
ok 

## 2022-09-22 ENCOUNTER — Inpatient Hospital Stay (HOSPITAL_COMMUNITY): Admit: 2022-09-22 | Payer: Medicaid Other | Admitting: Obstetrics and Gynecology

## 2022-09-23 ENCOUNTER — Inpatient Hospital Stay
Admission: EM | Admit: 2022-09-23 | Discharge: 2022-09-25 | DRG: 798 | Disposition: A | Discharge status: Home / Self Care | Payer: Medicaid Other | Attending: Obstetrics and Gynecology | Admitting: Obstetrics and Gynecology

## 2022-09-23 ENCOUNTER — Inpatient Hospital Stay: Payer: Medicaid Other | Admitting: Anesthesiology

## 2022-09-23 ENCOUNTER — Other Ambulatory Visit: Payer: Self-pay

## 2022-09-23 DIAGNOSIS — Z3A39 39 weeks gestation of pregnancy: Secondary | ICD-10-CM

## 2022-09-23 DIAGNOSIS — Z302 Encounter for sterilization: Secondary | ICD-10-CM

## 2022-09-23 DIAGNOSIS — O9902 Anemia complicating childbirth: Principal | ICD-10-CM | POA: Diagnosis present

## 2022-09-23 DIAGNOSIS — Z01812 Encounter for preprocedural laboratory examination: Secondary | ICD-10-CM

## 2022-09-23 DIAGNOSIS — Z87442 Personal history of urinary calculi: Secondary | ICD-10-CM

## 2022-09-23 DIAGNOSIS — Z3490 Encounter for supervision of normal pregnancy, unspecified, unspecified trimester: Secondary | ICD-10-CM | POA: Diagnosis present

## 2022-09-23 DIAGNOSIS — O26893 Other specified pregnancy related conditions, third trimester: Secondary | ICD-10-CM | POA: Diagnosis not present

## 2022-09-23 LAB — CBC
HCT: 33.2 % — ABNORMAL LOW (ref 36.0–46.0)
Hemoglobin: 11.2 g/dL — ABNORMAL LOW (ref 12.0–15.0)
MCH: 28.1 pg (ref 26.0–34.0)
MCHC: 33.7 g/dL (ref 30.0–36.0)
MCV: 83.4 fL (ref 80.0–100.0)
Platelets: 189 10*3/uL (ref 150–400)
RBC: 3.98 MIL/uL (ref 3.87–5.11)
RDW: 14.1 % (ref 11.5–15.5)
WBC: 11.5 10*3/uL — ABNORMAL HIGH (ref 4.0–10.5)
nRBC: 0 % (ref 0.0–0.2)

## 2022-09-23 LAB — HIV ANTIBODY (ROUTINE TESTING W REFLEX): HIV Screen 4th Generation wRfx: NONREACTIVE

## 2022-09-23 MED ORDER — SODIUM CHLORIDE 0.9 % IV SOLN
INTRAVENOUS | Status: DC | PRN
  Administered 2022-09-23 (×2): 5 mL via EPIDURAL

## 2022-09-23 MED ORDER — ONDANSETRON HCL 4 MG/2ML IJ SOLN: 4.0000 mg | Freq: Four times a day (QID) | INTRAMUSCULAR | Status: DC | PRN

## 2022-09-23 MED ORDER — LIDOCAINE HCL (PF) 1 % IJ SOLN
INTRAMUSCULAR | Status: AC
  Filled 2022-09-23: qty 30

## 2022-09-23 MED ORDER — OXYTOCIN 10 UNIT/ML IJ SOLN
INTRAMUSCULAR | Status: AC
  Filled 2022-09-23: qty 2

## 2022-09-23 MED ORDER — MISOPROSTOL 25 MCG QUARTER TABLET
50.0000 ug | ORAL_TABLET | ORAL | Status: DC
  Administered 2022-09-23: 50 ug via ORAL

## 2022-09-23 MED ORDER — FENTANYL-BUPIVACAINE-NACL 0.5-0.125-0.9 MG/250ML-% EP SOLN
EPIDURAL | Status: AC
  Filled 2022-09-23: qty 250

## 2022-09-23 MED ORDER — OXYCODONE-ACETAMINOPHEN 5-325 MG PO TABS: 1.0000 | ORAL_TABLET | ORAL | Status: DC | PRN

## 2022-09-23 MED ORDER — LACTATED RINGERS IV SOLN: INTRAVENOUS | Status: DC

## 2022-09-23 MED ORDER — ACETAMINOPHEN 325 MG PO TABS: 650.0000 mg | ORAL_TABLET | ORAL | Status: DC | PRN

## 2022-09-23 MED ORDER — TERBUTALINE SULFATE 1 MG/ML IJ SOLN: 0.2500 mg | Freq: Once | INTRAMUSCULAR | Status: DC | PRN

## 2022-09-23 MED ORDER — OXYTOCIN BOLUS FROM INFUSION
333.0000 mL | Freq: Once | INTRAVENOUS | Status: AC
  Administered 2022-09-24: 600 mL via INTRAVENOUS

## 2022-09-23 MED ORDER — FENTANYL-BUPIVACAINE-NACL 0.5-0.125-0.9 MG/250ML-% EP SOLN
EPIDURAL | Status: DC | PRN
  Administered 2022-09-23: 12 mL/h via EPIDURAL

## 2022-09-23 MED ORDER — LIDOCAINE HCL (PF) 1 % IJ SOLN: 30.0000 mL | INTRAMUSCULAR | Status: DC | PRN

## 2022-09-23 MED ORDER — MISOPROSTOL 200 MCG PO TABS
ORAL_TABLET | ORAL | Status: AC
  Filled 2022-09-23: qty 4

## 2022-09-23 MED ORDER — BUTORPHANOL TARTRATE 1 MG/ML IJ SOLN
1.0000 mg | INTRAMUSCULAR | Status: DC | PRN
  Administered 2022-09-23 (×3): 1 mg via INTRAVENOUS
  Filled 2022-09-23 (×3): qty 1

## 2022-09-23 MED ORDER — MISOPROSTOL 25 MCG QUARTER TABLET
50.0000 ug | ORAL_TABLET | ORAL | Status: DC
  Administered 2022-09-23: 50 ug via VAGINAL

## 2022-09-23 MED ORDER — OXYTOCIN-SODIUM CHLORIDE 30-0.9 UT/500ML-% IV SOLN
2.5000 [IU]/h | INTRAVENOUS | Status: DC
  Administered 2022-09-24: 2.5 [IU]/h via INTRAVENOUS

## 2022-09-23 MED ORDER — LACTATED RINGERS IV SOLN
500.0000 mL | INTRAVENOUS | Status: DC | PRN
  Administered 2022-09-24: 1000 mL via INTRAVENOUS

## 2022-09-23 MED ORDER — OXYTOCIN-SODIUM CHLORIDE 30-0.9 UT/500ML-% IV SOLN
INTRAVENOUS | Status: AC
  Administered 2022-09-23: 2 m[IU]/min via INTRAVENOUS
  Filled 2022-09-23: qty 500

## 2022-09-23 MED ORDER — SOD CITRATE-CITRIC ACID 500-334 MG/5ML PO SOLN: 30.0000 mL | ORAL | Status: DC | PRN

## 2022-09-23 MED ORDER — LIDOCAINE-EPINEPHRINE (PF) 1.5 %-1:200000 IJ SOLN
INTRAMUSCULAR | Status: DC | PRN
  Administered 2022-09-23: 3 mL via EPIDURAL

## 2022-09-23 MED ORDER — AMMONIA AROMATIC IN INHA
RESPIRATORY_TRACT | Status: AC
  Filled 2022-09-23: qty 10

## 2022-09-23 MED ORDER — OXYCODONE-ACETAMINOPHEN 5-325 MG PO TABS: 2.0000 | ORAL_TABLET | ORAL | Status: DC | PRN

## 2022-09-23 MED ORDER — OXYTOCIN-SODIUM CHLORIDE 30-0.9 UT/500ML-% IV SOLN: 2.5000 [IU]/h | INTRAVENOUS | Status: DC

## 2022-09-23 MED ORDER — MISOPROSTOL 50MCG HALF TABLET
ORAL_TABLET | ORAL | Status: AC
  Filled 2022-09-23: qty 2

## 2022-09-23 MED ORDER — LACTATED RINGERS IV SOLN: 500.0000 mL | INTRAVENOUS | Status: DC | PRN

## 2022-09-23 MED ORDER — OXYTOCIN BOLUS FROM INFUSION: 333.0000 mL | Freq: Once | INTRAVENOUS | Status: DC

## 2022-09-23 MED ORDER — MISOPROSTOL 25 MCG QUARTER TABLET
25.0000 ug | ORAL_TABLET | ORAL | Status: DC
  Administered 2022-09-23: 25 ug via ORAL
  Filled 2022-09-23: qty 1

## 2022-09-23 MED ORDER — LIDOCAINE HCL (PF) 1 % IJ SOLN
INTRAMUSCULAR | Status: DC | PRN
  Administered 2022-09-23: 3 mL via SUBCUTANEOUS

## 2022-09-23 MED ORDER — OXYTOCIN-SODIUM CHLORIDE 30-0.9 UT/500ML-% IV SOLN
1.0000 m[IU]/min | INTRAVENOUS | Status: DC
  Filled 2022-09-23: qty 500

## 2022-09-23 MED ORDER — BUTORPHANOL TARTRATE 2 MG/ML IJ SOLN
INTRAMUSCULAR | Status: AC
  Administered 2022-09-23: 1 mg via INTRAVENOUS
  Filled 2022-09-23: qty 1

## 2022-09-23 NOTE — H&P (Addendum)
Sherene Kampf is a 31 y.o. female presenting for IOL  39+4 weeks   OB History     Gravida  2   Para  1   Term  1   Preterm      AB      Living  1      SAB      IAB      Ectopic      Multiple  0   Live Births  1          Past Medical History:  Diagnosis Date   Gallstones 03/2018   History of kidney stones    Left lower quadrant abdominal tenderness 03/15/2016   Nausea & vomiting 05/09/2016   Past Surgical History:  Procedure Laterality Date   ROBOTIC ASSISTED LAPAROSCOPIC CHOLECYSTECTOMY-MULTI SITE N/A 04/14/2018   Procedure: ROBOTIC ASSISTED LAPAROSCOPIC CHOLECYSTECTOMY-MULTI SITE;  Surgeon: Leafy Ro, MD;  Location: [facility] ORS;  Service: General;  Laterality: N/A;   Family History: family history includes Breast cancer in her paternal aunt; Diabetes in her paternal grandfather; Uterine cancer in her maternal aunt. Social History:  reports that she has never smoked. She has never used smokeless tobacco. She reports current alcohol use. She reports that she does not currently use drugs after having used the following drugs: Marijuana.     Maternal Diabetes: No, nl 3 GTT Genetic Screening: Normal Maternal Ultrasounds/Referrals: Normal Fetal Ultrasounds or other Referrals:  None Maternal Substance Abuse:  No Significant Maternal Medications:  None Significant Maternal Lab Results:  Group B Strep negative Number of Prenatal Visits:greater than 3 verified prenatal visits Other Comments:  None  Review of Systems History Dilation: 1.5 Effacement (%): 50 Station: -2 Exam by:: Scherm MD Blood pressure 117/68, pulse 87, temperature 98.3 F (36.8 C), temperature source Oral, resp. rate 12, height 5\' 2"  (1.575 m), weight 73 kg. Exam Physical Exam  CV RRR  Lungs CTA  Abd : gravid  Prenatal labs: ABO, Rh: --/--/A POS (11/30 06-17-2000) Antibody: POS (11/30 0048) Rubella Imm , VZ IMM  HBsAg:  neg  HIV:   neg GBS:   neg   Assessment/Plan: IOL , elective   Cytotec induction . Now [facility]   Stadol prn pain   Reassuring  fetal monitoring    06-17-2000 Qadir Folks 09/23/2022, 9:52 AM

## 2022-09-23 NOTE — Anesthesia Preprocedure Evaluation (Signed)
Anesthesia Evaluation  Patient identified by MRN, date of birth, ID band Patient awake    Reviewed: Allergy & Precautions, H&P , NPO status , Patient's Chart, lab work & pertinent test results  History of Anesthesia Complications Negative for: history of anesthetic complications  Airway Mallampati: III  TM Distance: >3 FB Neck ROM: full    Dental  (+) Chipped Permanent retainer on the bottom:   Pulmonary neg pulmonary ROS          Cardiovascular Exercise Tolerance: Good (-) angina (-) Past MI and (-) DOE negative cardio ROS      Neuro/Psych negative neurological ROS  negative psych ROS   GI/Hepatic negative GI ROS, Neg liver ROS,neg GERD  ,,  Endo/Other  negative endocrine ROS    Renal/GU      Musculoskeletal   Abdominal   Peds  Hematology negative hematology ROS (+)   Anesthesia Other Findings Past Medical History: 03/2018: Gallstones No date: History of kidney stones 03/15/2016: Left lower quadrant abdominal tenderness 05/09/2016: Nausea & vomiting  History reviewed. No pertinent surgical history.  BMI    Body Mass Index:  29.55 kg/m      Reproductive/Obstetrics (+) Pregnancy                             Anesthesia Physical Anesthesia Plan  ASA: 2  Anesthesia Plan: Epidural   Post-op Pain Management:    Induction:   PONV Risk Score and Plan:   Airway Management Planned:   Additional Equipment:   Intra-op Plan:   Post-operative Plan:   Informed Consent: I have reviewed the patients History and Physical, chart, labs and discussed the procedure including the risks, benefits and alternatives for the proposed anesthesia with the patient or authorized representative who has indicated his/her understanding and acceptance.     Dental Advisory Given  Plan Discussed with: Anesthesiologist, CRNA and Surgeon  Anesthesia Plan Comments:         Anesthesia Quick  Evaluation

## 2022-09-23 NOTE — Progress Notes (Signed)
Carla Vincent is a 31 y.o. G2P1001 at [redacted]w[redacted]d IOL   S/p 2 rounds of cytotec   Subjective:  Painful CTX  Objective: BP 117/68   Pulse 87   Temp 98.3 F (36.8 C) (Oral)   Resp 12   Ht 5\' 2"  (1.575 m)   Wt 73 kg   BMI 29.45 kg/m  No intake/output data recorded. No intake/output data recorded.  FHT:  FHR: 120 bpm, variability: moderate,  accelerations:  Present,  decelerations:  Absent UC:   regular, every 2 min SVE:   Dilation: 1.5 Effacement (%): 50 Station: -2 Exam by:: Scherm MD [facility] clear  Labs: Lab Results  Component Value Date   WBC 11.5 (H) 09/23/2022   HGB 11.2 (L) 09/23/2022   HCT 33.2 (L) 09/23/2022   MCV 83.4 09/23/2022   PLT 189 09/23/2022    Assessment / Plan:  IOL , arom clear   Stadol prn pain  Anticipater SVD . Add Pitocin as needed   09/25/2022, MD 09/23/2022, 9:38 AM

## 2022-09-23 NOTE — Progress Notes (Signed)
Carla Vincent is a 31 y.o. G2P1001 at [redacted]w[redacted]d  Subjective:  Painful ctx  Pitocin  at 16 mu/ min and adequate ctx  Objective: BP 131/73 (BP Location: Right Arm)   Pulse 85   Temp 97.9 F (36.6 C) (Oral)   Resp 18   Ht 5\' 2"  (1.575 m)   Wt 73 kg   BMI 29.45 kg/m  No intake/output data recorded. No intake/output data recorded.  FHT:  FHR: 125 bpm, variability: moderate,  accelerations:  Present,  decelerations:  Absent UC:   regular, every q2 minutes SVE:   Dilation: 4 Effacement (%): 80 Station: -2 Exam by:: Scherm MD  Labs: Lab Results  Component Value Date   WBC 11.5 (H) 09/23/2022   HGB 11.2 (L) 09/23/2022   HCT 33.2 (L) 09/23/2022   MCV 83.4 09/23/2022   PLT 189 09/23/2022    Assessment / Plan: Active labor  Cat 1 FH pattern Cont Pitocin  Anticipate SVD  09/25/2022, MD 09/23/2022, 5:47 PM

## 2022-09-23 NOTE — Progress Notes (Signed)
Attempted to continue induction protocol but all orders were discontinued. At [phone] I contacted Dr. Feliberto Gottron for a verbal order for Cytotec. I was given a verbal order 50 mcg vaginal and 50 mcg oral. I repeated the order back and administered the Cytotec at 0601  Jessy Calixte N Jess Toney 09/23/2022 6:55 AM

## 2022-09-23 NOTE — Anesthesia Procedure Notes (Signed)
Epidural Patient location during procedure: OB Start time: 09/23/2022 9:39 PM End time: 09/23/2022 9:46 PM  Staffing Anesthesiologist: Lenard Simmer, MD Performed: anesthesiologist   Preanesthetic Checklist Completed: patient identified, IV checked, site marked, risks and benefits discussed, surgical consent, monitors and equipment checked, pre-op evaluation and timeout performed  Epidural Patient position: sitting Prep: ChloraPrep Patient monitoring: heart rate, continuous pulse ox and blood pressure Approach: midline Location: L3-L4 Injection technique: LOR saline  Needle:  Needle type: Tuohy  Needle gauge: 17 G Needle length: 9 cm Needle insertion depth: 4.5 cm Catheter type: closed end flexible Catheter size: 19 Gauge Catheter at skin depth: 9 cm Test dose: negative and 1.5% lidocaine with Epi 1:200 K  Assessment Sensory level: T10 Events: blood not aspirated, no cerebrospinal fluid, injection not painful, no injection resistance, no paresthesia and negative IV test  Additional Notes 1st attempt Pt. Evaluated and documentation done after procedure finished. Patient identified. Risks/Benefits/Options discussed with patient including but not limited to bleeding, infection, nerve damage, paralysis, failed block, incomplete pain control, headache, blood pressure changes, nausea, vomiting, reactions to medication both or allergic, itching and postpartum back pain. Confirmed with bedside nurse the patient's most recent platelet count. Confirmed with patient that they are not currently taking any anticoagulation, have any bleeding history or any family history of bleeding disorders. Patient expressed understanding and wished to proceed. All questions were answered. Sterile technique was used throughout the entire procedure. Please see nursing notes for vital signs. Test dose was given through epidural catheter and negative prior to continuing to dose epidural or start infusion.  Warning signs of high block given to the patient including shortness of breath, tingling/numbness in hands, complete motor block, or any concerning symptoms with instructions to call for help. Patient was given instructions on fall risk and not to get out of bed. All questions and concerns addressed with instructions to call with any issues or inadequate analgesia.    Patient tolerated the insertion well without immediate complications.Reason for block:procedure for pain

## 2022-09-23 NOTE — Progress Notes (Signed)
Carla Vincent is a 31 y.o. G2P1001 at [redacted]w[redacted]d Subjective: Painful ctx, stadol use . Declines CLE   Objective: BP 131/74 (BP Location: Right Arm)   Pulse 84   Temp 98 F (36.7 C) (Oral)   Resp 18   Ht 5\' 2"  (1.575 m)   Wt 73 kg   BMI 29.45 kg/m  No intake/output data recorded. Total I/O In: -  Out: 1 [Urine:1]  FHT:  125 + accels , good variability , no decels  UC:   regular, every 2 minutes SVE:   Dilation: 4.5 Effacement (%): 90 Station: -2 Exam by:: [email or web address], RN  Labs: Lab Results  Component Value Date   WBC 11.5 (H) 09/23/2022   HGB 11.2 (L) 09/23/2022   HCT 33.2 (L) 09/23/2022   MCV 83.4 09/23/2022   PLT 189 09/23/2022    Assessment / Plan:  Cat 1 stripe , slow progression adequate CTX  Continue Pitocin   09/25/2022, MD 09/23/2022, 8:56 PM

## 2022-09-23 NOTE — Progress Notes (Signed)
Carla Vincent is a 31 y.o. G2P1001 at [redacted]w[redacted]d by admitted for  Pitocin a 8 mu/min  Subjective: Ctx painful   Objective: BP (!) 116/95 (BP Location: Left Arm)   Pulse 80   Temp 97.9 F (36.6 C) (Oral)   Resp 18   Ht 5\' 2"  (1.575 m)   Wt 73 kg   BMI 29.45 kg/m  No intake/output data recorded. No intake/output data recorded.  FHE 125 + accels , good variability , no decels  Irregular CTX  Labs: Lab Results  Component Value Date   WBC 11.5 (H) 09/23/2022   HGB 11.2 (L) 09/23/2022   HCT 33.2 (L) 09/23/2022   MCV 83.4 09/23/2022   PLT 189 09/23/2022    Assessment / Plan:  IUPC placed . Increase Pitocin to get adequate pattern  09/25/2022, MD 09/23/2022, 2:29 PM

## 2022-09-24 ENCOUNTER — Encounter: Payer: Self-pay | Admitting: Obstetrics and Gynecology

## 2022-09-24 ENCOUNTER — Other Ambulatory Visit: Payer: Self-pay

## 2022-09-24 ENCOUNTER — Encounter
Admission: EM | Disposition: A | Discharge status: Home / Self Care | Payer: Self-pay | Source: Home / Self Care | Attending: Obstetrics and Gynecology

## 2022-09-24 ENCOUNTER — Inpatient Hospital Stay: Payer: Medicaid Other | Admitting: General Practice

## 2022-09-24 DIAGNOSIS — Z419 Encounter for procedure for purposes other than remedying health state, unspecified: Secondary | ICD-10-CM | POA: Diagnosis not present

## 2022-09-24 DIAGNOSIS — Z9851 Tubal ligation status: Secondary | ICD-10-CM | POA: Diagnosis not present

## 2022-09-24 DIAGNOSIS — Z302 Encounter for sterilization: Secondary | ICD-10-CM | POA: Diagnosis not present

## 2022-09-24 DIAGNOSIS — Z3A39 39 weeks gestation of pregnancy: Secondary | ICD-10-CM | POA: Diagnosis not present

## 2022-09-24 HISTORY — PX: TUBAL LIGATION: SHX77

## 2022-09-24 LAB — TYPE AND SCREEN
ABO/RH(D): A POS
Antibody Screen: POSITIVE
Unit division: 0
Unit division: 0

## 2022-09-24 LAB — BPAM RBC
Blood Product Expiration Date: 202312202359
Blood Product Expiration Date: 202312222359
Unit Type and Rh: 6200
Unit Type and Rh: 6200

## 2022-09-24 LAB — CBC
HCT: 32.1 % — ABNORMAL LOW (ref 36.0–46.0)
Hemoglobin: 10.7 g/dL — ABNORMAL LOW (ref 12.0–15.0)
MCH: 27.6 pg (ref 26.0–34.0)
MCHC: 33.3 g/dL (ref 30.0–36.0)
MCV: 82.9 fL (ref 80.0–100.0)
Platelets: 181 10*3/uL (ref 150–400)
RBC: 3.87 MIL/uL (ref 3.87–5.11)
RDW: 13.9 % (ref 11.5–15.5)
WBC: 19.4 10*3/uL — ABNORMAL HIGH (ref 4.0–10.5)
nRBC: 0 % (ref 0.0–0.2)

## 2022-09-24 SURGERY — LIGATION, FALLOPIAN TUBE, POSTPARTUM
Anesthesia: General | Laterality: Bilateral

## 2022-09-24 MED ORDER — ONDANSETRON HCL 4 MG PO TABS: 4.0000 mg | ORAL_TABLET | ORAL | Status: DC | PRN

## 2022-09-24 MED ORDER — KETOROLAC TROMETHAMINE 30 MG/ML IJ SOLN
INTRAMUSCULAR | Status: DC | PRN
  Administered 2022-09-24: 15 mg via INTRAVENOUS

## 2022-09-24 MED ORDER — FENTANYL CITRATE (PF) 100 MCG/2ML IJ SOLN
INTRAMUSCULAR | Status: DC | PRN
  Administered 2022-09-24 (×2): 50 ug via INTRAVENOUS

## 2022-09-24 MED ORDER — PRENATAL MULTIVITAMIN CH
1.0000 | ORAL_TABLET | Freq: Every day | ORAL | Status: DC
  Administered 2022-09-24: 1 via ORAL
  Filled 2022-09-24: qty 1

## 2022-09-24 MED ORDER — FERROUS SULFATE 325 (65 FE) MG PO TABS
325.0000 mg | ORAL_TABLET | Freq: Two times a day (BID) | ORAL | Status: DC
  Administered 2022-09-24 – 2022-09-25 (×2): 325 mg via ORAL
  Filled 2022-09-24 (×2): qty 1

## 2022-09-24 MED ORDER — MISOPROSTOL 25 MCG QUARTER TABLET: 50.0000 ug | ORAL_TABLET | Freq: Once | ORAL | Status: DC

## 2022-09-24 MED ORDER — BUPIVACAINE HCL (PF) 0.5 % IJ SOLN
INTRAMUSCULAR | Status: AC
  Filled 2022-09-24: qty 30

## 2022-09-24 MED ORDER — SIMETHICONE 80 MG PO CHEW: 80.0000 mg | CHEWABLE_TABLET | ORAL | Status: DC | PRN

## 2022-09-24 MED ORDER — PHENYLEPHRINE 80 MCG/ML (10ML) SYRINGE FOR IV PUSH (FOR BLOOD PRESSURE SUPPORT): 80.0000 ug | PREFILLED_SYRINGE | INTRAVENOUS | Status: DC | PRN

## 2022-09-24 MED ORDER — MAGNESIUM HYDROXIDE 400 MG/5ML PO SUSP: 30.0000 mL | ORAL | Status: DC | PRN

## 2022-09-24 MED ORDER — LACTATED RINGERS IV SOLN: INTRAVENOUS | Status: DC | PRN

## 2022-09-24 MED ORDER — BUPIVACAINE HCL 0.5 % IJ SOLN
INTRAMUSCULAR | Status: DC | PRN
  Administered 2022-09-24: 6 mL

## 2022-09-24 MED ORDER — ROCURONIUM BROMIDE 100 MG/10ML IV SOLN
INTRAVENOUS | Status: DC | PRN
  Administered 2022-09-24: 50 mg via INTRAVENOUS

## 2022-09-24 MED ORDER — WITCH HAZEL-GLYCERIN EX PADS
1.0000 | MEDICATED_PAD | CUTANEOUS | Status: DC | PRN
  Filled 2022-09-24: qty 100

## 2022-09-24 MED ORDER — MIDAZOLAM HCL 2 MG/2ML IJ SOLN
INTRAMUSCULAR | Status: DC | PRN
  Administered 2022-09-24: 2 mg via INTRAVENOUS

## 2022-09-24 MED ORDER — SENNOSIDES-DOCUSATE SODIUM 8.6-50 MG PO TABS: 2.0000 | ORAL_TABLET | ORAL | Status: DC

## 2022-09-24 MED ORDER — OXYCODONE-ACETAMINOPHEN 5-325 MG PO TABS: 1.0000 | ORAL_TABLET | ORAL | Status: DC | PRN

## 2022-09-24 MED ORDER — SUGAMMADEX SODIUM 200 MG/2ML IV SOLN
INTRAVENOUS | Status: DC | PRN
  Administered 2022-09-24: 200 mg via INTRAVENOUS

## 2022-09-24 MED ORDER — ONDANSETRON HCL 4 MG/2ML IJ SOLN
INTRAMUSCULAR | Status: DC | PRN
  Administered 2022-09-24: 4 mg via INTRAVENOUS

## 2022-09-24 MED ORDER — ZOLPIDEM TARTRATE 5 MG PO TABS: 5.0000 mg | ORAL_TABLET | Freq: Every evening | ORAL | Status: DC | PRN

## 2022-09-24 MED ORDER — MEASLES, MUMPS & RUBELLA VAC IJ SOLR
0.5000 mL | Freq: Once | INTRAMUSCULAR | Status: DC
  Filled 2022-09-24: qty 0.5

## 2022-09-24 MED ORDER — ACETAMINOPHEN 500 MG PO TABS
1000.0000 mg | ORAL_TABLET | Freq: Once | ORAL | Status: AC
  Administered 2022-09-24: 1000 mg via ORAL
  Filled 2022-09-24: qty 2

## 2022-09-24 MED ORDER — OXYCODONE HCL 5 MG PO TABS: 5.0000 mg | ORAL_TABLET | Freq: Once | ORAL | Status: DC | PRN

## 2022-09-24 MED ORDER — ONDANSETRON HCL 4 MG/2ML IJ SOLN: 4.0000 mg | INTRAMUSCULAR | Status: DC | PRN

## 2022-09-24 MED ORDER — DIPHENHYDRAMINE HCL 50 MG/ML IJ SOLN: 12.5000 mg | INTRAMUSCULAR | Status: DC | PRN

## 2022-09-24 MED ORDER — OXYCODONE HCL 5 MG/5ML PO SOLN: 5.0000 mg | Freq: Once | ORAL | Status: DC | PRN

## 2022-09-24 MED ORDER — IBUPROFEN 600 MG PO TABS
ORAL_TABLET | ORAL | Status: AC
  Filled 2022-09-24: qty 1

## 2022-09-24 MED ORDER — GABAPENTIN 300 MG PO CAPS
300.0000 mg | ORAL_CAPSULE | Freq: Once | ORAL | Status: AC
  Administered 2022-09-24: 300 mg via ORAL
  Filled 2022-09-24: qty 1

## 2022-09-24 MED ORDER — FENTANYL CITRATE (PF) 100 MCG/2ML IJ SOLN
INTRAMUSCULAR | Status: AC
  Administered 2022-09-24: 25 ug via INTRAVENOUS
  Filled 2022-09-24: qty 2

## 2022-09-24 MED ORDER — DIPHENHYDRAMINE HCL 25 MG PO CAPS: 25.0000 mg | ORAL_CAPSULE | Freq: Four times a day (QID) | ORAL | Status: DC | PRN

## 2022-09-24 MED ORDER — OXYCODONE-ACETAMINOPHEN 5-325 MG PO TABS: 2.0000 | ORAL_TABLET | ORAL | Status: DC | PRN

## 2022-09-24 MED ORDER — EPHEDRINE 5 MG/ML INJ: 10.0000 mg | INTRAVENOUS | Status: DC | PRN

## 2022-09-24 MED ORDER — 0.9 % SODIUM CHLORIDE (POUR BTL) OPTIME
TOPICAL | Status: DC | PRN
  Administered 2022-09-24: 150 mL

## 2022-09-24 MED ORDER — PROPOFOL 10 MG/ML IV BOLUS
INTRAVENOUS | Status: DC | PRN
  Administered 2022-09-24: 50 mg via INTRAVENOUS
  Administered 2022-09-24: 150 mg via INTRAVENOUS

## 2022-09-24 MED ORDER — FENTANYL CITRATE (PF) 100 MCG/2ML IJ SOLN
25.0000 ug | INTRAMUSCULAR | Status: DC | PRN
  Administered 2022-09-24: 25 ug via INTRAVENOUS

## 2022-09-24 MED ORDER — BENZOCAINE-MENTHOL 20-0.5 % EX AERO
1.0000 | INHALATION_SPRAY | CUTANEOUS | Status: DC | PRN
  Filled 2022-09-24: qty 56

## 2022-09-24 MED ORDER — FENTANYL-BUPIVACAINE-NACL 0.5-0.125-0.9 MG/250ML-% EP SOLN: 12.0000 mL/h | EPIDURAL | Status: DC | PRN

## 2022-09-24 MED ORDER — COCONUT OIL OIL: 1.0000 | TOPICAL_OIL | Status: DC | PRN

## 2022-09-24 MED ORDER — LACTATED RINGERS IV SOLN: 500.0000 mL | Freq: Once | INTRAVENOUS | Status: DC

## 2022-09-24 MED ORDER — DEXAMETHASONE SODIUM PHOSPHATE 10 MG/ML IJ SOLN
INTRAMUSCULAR | Status: DC | PRN
  Administered 2022-09-24: 6 mg via INTRAVENOUS

## 2022-09-24 MED ORDER — DIBUCAINE (PERIANAL) 1 % EX OINT
1.0000 | TOPICAL_OINTMENT | CUTANEOUS | Status: DC | PRN
  Filled 2022-09-24: qty 28

## 2022-09-24 MED ORDER — LIDOCAINE HCL (CARDIAC) PF 100 MG/5ML IV SOSY
PREFILLED_SYRINGE | INTRAVENOUS | Status: DC | PRN
  Administered 2022-09-24: 80 mg via INTRAVENOUS

## 2022-09-24 MED ORDER — IBUPROFEN 600 MG PO TABS
600.0000 mg | ORAL_TABLET | Freq: Four times a day (QID) | ORAL | Status: DC
  Administered 2022-09-24 – 2022-09-25 (×4): 600 mg via ORAL
  Filled 2022-09-24 (×3): qty 1

## 2022-09-24 SURGICAL SUPPLY — 29 items
BLADE SURG SZ11 CARB STEEL (BLADE) ×1 IMPLANT
CHLORAPREP W/TINT 26 (MISCELLANEOUS) ×1 IMPLANT
DERMABOND ADVANCED .7 DNX12 (GAUZE/BANDAGES/DRESSINGS) ×1 IMPLANT
DRAPE LAPAROTOMY 77X122 PED (DRAPES) ×1 IMPLANT
DRSG TEGADERM 2-3/8X2-3/4 SM (GAUZE/BANDAGES/DRESSINGS) IMPLANT
ELECT CAUTERY BLADE 6.4 (BLADE) ×1 IMPLANT
ELECT REM PT RETURN 9FT ADLT (ELECTROSURGICAL) ×1
ELECTRODE REM PT RTRN 9FT ADLT (ELECTROSURGICAL) ×1 IMPLANT
GAUZE 4X4 16PLY ~~LOC~~+RFID DBL (SPONGE) ×1 IMPLANT
GLOVE SURG SYN 8.0 (GLOVE) ×1 IMPLANT
GLOVE SURG SYN 8.0 PF PI (GLOVE) ×1 IMPLANT
GOWN STRL REUS W/ TWL LRG LVL3 (GOWN DISPOSABLE) ×1 IMPLANT
GOWN STRL REUS W/ TWL XL LVL3 (GOWN DISPOSABLE) ×1 IMPLANT
GOWN STRL REUS W/TWL LRG LVL3 (GOWN DISPOSABLE) ×1
GOWN STRL REUS W/TWL XL LVL3 (GOWN DISPOSABLE) ×1
KIT TURNOVER KIT A (KITS) ×1 IMPLANT
LABEL OR SOLS (LABEL) ×1 IMPLANT
MANIFOLD NEPTUNE II (INSTRUMENTS) ×1 IMPLANT
NEEDLE HYPO 22GX1.5 SAFETY (NEEDLE) ×1 IMPLANT
NS IRRIG 500ML POUR BTL (IV SOLUTION) ×1 IMPLANT
PACK BASIN MINOR ARMC (MISCELLANEOUS) ×1 IMPLANT
SPONGE GAUZE 2X2 8PLY STRL LF (GAUZE/BANDAGES/DRESSINGS) ×1 IMPLANT
SUT PLAIN GUT 0 (SUTURE) ×2 IMPLANT
SUT VIC AB 2-0 UR6 27 (SUTURE) ×1 IMPLANT
SUT VIC AB 4-0 SH 27 (SUTURE) ×1
SUT VIC AB 4-0 SH 27XANBCTRL (SUTURE) ×1 IMPLANT
SYR 10ML LL (SYRINGE) ×1 IMPLANT
TRAP FLUID SMOKE EVACUATOR (MISCELLANEOUS) ×1 IMPLANT
WATER STERILE IRR 500ML POUR (IV SOLUTION) ×1 IMPLANT

## 2022-09-24 NOTE — Brief Op Note (Signed)
09/24/2022  2:12 PM  PATIENT:  Carla Vincent  31 y.o. female  PRE-OPERATIVE DIAGNOSIS:  desires permanant sterilization  POST-OPERATIVE DIAGNOSIS:  desires permanant sterilization  PROCEDURE:  Procedure(s): POST PARTUM TUBAL LIGATION (Bilateral)  SURGEON:  Surgeon(s) and Role:    * Chandy Tarman, Ihor Austin, MD - Primary  PHYSICIAN ASSISTANT: CST  ASSISTANTS: none   ANESTHESIA:   general  EBL:  5 mL  IOF 600 cc  BLOOD ADMINISTERED:none  DRAINS: none   LOCAL MEDICATIONS USED:  MARCAINE     SPECIMEN:  Source of Specimen:  portion right and left tube   DISPOSITION OF SPECIMEN:  PATHOLOGY  COUNTS:  YES  TOURNIQUET:  * No tourniquets in log *  DICTATION: .Other Dictation: Dictation Number verbal;  PLAN OF CARE: Admit to inpatient   PATIENT DISPOSITION:  PACU - hemodynamically stable.   Delay start of Pharmacological VTE agent (>24hrs) due to surgical blood loss or risk of bleeding: not applicable

## 2022-09-24 NOTE — Op Note (Unsigned)
NAMEESHIKA, RECKART MEDICAL RECORD NO: [id number] ACCOUNT NO: [id number] DATE OF BIRTH: 12/12/1990 FACILITY: [facility] LOCATION: [facility] PHYSICIAN: Suzy Bouchard, MD  Operative Report   DATE OF PROCEDURE: 09/24/2022  PREOPERATIVE DIAGNOSIS:  Elective permanent sterilization -- postpartum.  POSTOPERATIVE DIAGNOSIS:  Elective permanent sterilization -- postpartum.  PROCEDURE:  Bilateral tubal ligation -- [place].  SURGEON:  Suzy Bouchard, MD  ANESTHESIA:  General endotracheal anesthesia.  INDICATIONS:  The patient is a 31 year old gravida 2, para 2, status post spontaneous vaginal delivery approximately 11 hours previous.  The patient has elected for permanent sterilization and reconfirms the desire on the postpartum day #1.  The patient  is aware of the failure rate of 1/300 per year.  DESCRIPTION OF PROCEDURE:  After adequate general endotracheal anesthesia, the patient was placed in dorsal supine position.  Timeout was performed.  The patient's abdomen was prepped and draped in normal sterile fashion.  A 15 mm infraumbilical incision  was made.  Sharp dissection was used to identify the fascia, which was opened and the peritoneum was opened sharply as well.  The patient was rolled to the surgeon's side and the right fallopian tube was identified, grasped with a Babcock clamp.  The  fimbriated end was visualized.  At the midportion of the fallopian tube, 2 separate 0 plain gut sutures were placed and a 1.5 cm portion of fallopian tube was removed.  Good hemostasis was noted.  Similar procedure was repeated on the patient's left  fallopian tube. After visualizing the fimbriated end, 2 separate 0 plain gut sutures were placed in the midportion of the fallopian tube and a 1.5 cm portion of fallopian tube was removed.  Good hemostasis noted.  Fascia was closed with a running 2-0  Vicryl suture and the skin was reapproximated with interrupted 4-0 Vicryl suture.  Sterile dressing  applied.  The patient did receive 30 mg IV Toradol.  INTRAOPERATIVE FLUIDS:  600 mL.  ESTIMATED BLOOD LOSS:  Minimal.  DISPOSITION:  The patient was taken to recovery room in good condition.   MUK D: 09/24/2022 2:20:12 pm T: 09/24/2022 9:39:00 pm  JOB: [id number]/ [id number]

## 2022-09-24 NOTE — Anesthesia Preprocedure Evaluation (Signed)
Anesthesia Evaluation  Patient identified by MRN, date of birth, ID band Patient awake    Reviewed: Allergy & Precautions, NPO status , Patient's Chart, lab work & pertinent test results  History of Anesthesia Complications Negative for: history of anesthetic complications  Airway Mallampati: III  TM Distance: >3 FB Neck ROM: full    Dental  (+) Chipped   Pulmonary neg pulmonary ROS, neg shortness of breath, neg COPD   Pulmonary exam normal        Cardiovascular (-) Past MI and (-) CABG negative cardio ROS Normal cardiovascular exam     Neuro/Psych negative neurological ROS  negative psych ROS   GI/Hepatic negative GI ROS, Neg liver ROS,,,  Endo/Other  negative endocrine ROS    Renal/GU      Musculoskeletal   Abdominal   Peds  Hematology negative hematology ROS (+)   Anesthesia Other Findings Past Medical History: 03/2018: Gallstones No date: History of kidney stones 03/15/2016: Left lower quadrant abdominal tenderness 05/09/2016: Nausea & vomiting  Past Surgical History: 04/14/2018: ROBOTIC ASSISTED LAPAROSCOPIC CHOLECYSTECTOMY-MULTI SITE;  N/A     Comment:  Procedure: ROBOTIC ASSISTED LAPAROSCOPIC               CHOLECYSTECTOMY-MULTI SITE;  Surgeon: Leafy Ro, MD;              Location: [facility] ORS;  Service: General;  Laterality: N/A;  BMI    Body Mass Index: 29.45 kg/m      Reproductive/Obstetrics negative OB ROS                             Anesthesia Physical Anesthesia Plan  ASA: 2  Anesthesia Plan: General ETT   Post-op Pain Management:    Induction: Intravenous  PONV Risk Score and Plan: 4 or greater and Midazolam, Dexamethasone and Ondansetron  Airway Management Planned: Oral ETT  Additional Equipment:   Intra-op Plan:   Post-operative Plan: Extubation in OR  Informed Consent: I have reviewed the patients History and Physical, chart, labs and discussed  the procedure including the risks, benefits and alternatives for the proposed anesthesia with the patient or authorized representative who has indicated his/her understanding and acceptance.     Dental Advisory Given  Plan Discussed with: Anesthesiologist, CRNA and Surgeon  Anesthesia Plan Comments: (Patient consented for risks of anesthesia including but not limited to:  - adverse reactions to medications - damage to eyes, teeth, lips or other oral mucosa - nerve damage due to positioning  - sore throat or hoarseness - Damage to heart, brain, nerves, lungs, other parts of body or loss of life  Patient voiced understanding.)       Anesthesia Quick Evaluation

## 2022-09-24 NOTE — Transfer of Care (Signed)
Immediate Anesthesia Transfer of Care Note  Patient: Carla Vincent  Procedure(s) Performed: POST PARTUM TUBAL LIGATION (Bilateral)  Patient Location: PACU  Anesthesia Type:General  Level of Consciousness: awake, alert , and oriented  Airway & Oxygen Therapy: Patient Spontanous Breathing and Patient connected to face mask oxygen  Post-op Assessment: Report given to RN and Post -op Vital signs reviewed and stable  Post vital signs: Reviewed and stable  Last Vitals:  Vitals Value Taken Time  BP 127/75 09/24/22 1420  Temp 36.1 C 09/24/22 1420  Pulse 107 09/24/22 1428  Resp 13 09/24/22 1428  SpO2 96 % 09/24/22 1428  Vitals shown include unvalidated device data.  Last Pain:  Vitals:   09/24/22 1420  TempSrc:   PainSc: 0-No pain      Patients Stated Pain Goal: 0 (09/23/22 0715)  Complications: No notable events documented.

## 2022-09-24 NOTE — Discharge Summary (Signed)
Obstetrical Discharge Summary  Patient Name: Carla Vincent DOB: 1990/11/10 MRN: [id number]  Date of Admission: 09/23/2022 Date of Delivery: 09/24/22 Delivered by: Beverly Gust MD Date of Discharge: 09/25/22  [facility] LMP:No LMP recorded. EDC Estimated Date of Delivery: 09/26/22 Gestational Age at Delivery: [redacted]w[redacted]d   Antepartum complications:  Anemia   Admitting Diagnosis: Encounter for elective induction of labor [Z34.90] Encounter for induction of labor [Z34.90]  Secondary Diagnosis: Patient Active Problem List   Diagnosis Date Noted   NSVD (normal spontaneous vaginal delivery) 09/25/2022   Supervision of normal pregnancy 03/16/2022   Chronic cholecystitis    Labor and delivery, indication for care 06/28/2016   Nausea & vomiting 05/09/2016   Left lower quadrant abdominal tenderness 03/15/2016    Discharge Diagnosis: Term Pregnancy Delivered      Induction: Pitocin and Cytotec Complications: None Intrapartum complications/course: labored > 24 hours  Delivery Type: spontaneous vaginal delivery Anesthesia: epidural anesthesia Placenta: spontaneous To Pathology: No  Laceration: nnone Episiotomy: none Newborn Data: Live born female  Birth Weight: 6 lb 11.6 oz (3050 g) APGAR: 9, 9  Newborn Delivery   Birth date/time: 09/24/2022 02:14:00 Delivery type: Vaginal, Spontaneous      Postpartum Procedures: P.P. tubal ligation [place]:     09/24/2022    8:30 AM  Carla Vincent Postnatal Depression Scale Screening Tool  I have been able to laugh and see the funny side of things. 0  I have looked forward with enjoyment to things. 0  I have blamed myself unnecessarily when things went wrong. 0  I have been anxious or worried for no good reason. 0  I have felt scared or panicky for no good reason. 0  Things have been getting on top of me. 0  I have been so unhappy that I have had difficulty sleeping. 0  I have felt sad or miserable. 0  I have been so  unhappy that I have been crying. 0  The thought of harming myself has occurred to me. 0  Edinburgh Postnatal Depression Scale Total 0     Post partum course:  Patient had an uncomplicated postpartum course.  By time of discharge on PPD#2, her pain was controlled on oral pain medications; she had appropriate lochia and was ambulating, voiding without difficulty and tolerating regular diet.  She was deemed stable for discharge to home.    Discharge Physical Exam:  BP 125/75 (BP Location: Left Arm)   Pulse 94   Temp 98 F (36.7 C) (Oral)   Resp 20   Ht 5\' 2"  (1.575 m)   Wt 73 kg   SpO2 100%   Breastfeeding Unknown Comment: Had her baby this morning  BMI 29.45 kg/m   General: NAD CV: RRR Pulm: nl effort ABD: s/nd/nt, fundus firm and below the umbilicus Lochia: moderate Perineum:minimal edema/intact Incision: c/d/I, covered with occlusive OP site dressing at umbilicus DVT Evaluation: LE non-ttp, no evidence of DVT on exam.  Hemoglobin  Date Value Ref Range Status  09/24/2022 10.7 (L) 12.0 - 15.0 g/dL Final   HGB  Date Value Ref Range Status  08/18/2013 14.2 12.0 - 16.0 g/dL Final   HCT  Date Value Ref Range Status  09/24/2022 32.1 (L) 36.0 - 46.0 % Final  08/18/2013 41.7 35.0 - 47.0 % Final    Risk assessment for postpartum VTE and prophylactic treatment: Very high risk factors: None High risk factors: None Moderate risk factors: None  Postpartum VTE prophylaxis with LMWH not indicated  Disposition: stable, discharge  to home. Baby Feeding: breast feeding Baby Disposition: home with mom  Rh Immune globulin indicated: No Rubella vaccine given: was not indicated Varivax vaccine given: was not indicated Flu vaccine given in AP setting: Given 08/25/22  Tdap vaccine given in AP setting: received 07/09/22   Contraception: bilateral tubal ligation - completed in hospital  Prenatal Labs:  Blood type/Rh A pos  Antibody screen POS  Rubella Immune  Varicella Immune   RPR NR  HBsAg Neg  HIV NR  GC neg  Chlamydia neg  Genetic screening negative  1 hour GTT 168  3 hour GTT passed (80, 148, 152, 136)   GBS GBS    Plan:  Carla Vincent was discharged to home in good condition.   Discharge Medications: Allergies as of 09/25/2022   No Known Allergies      Medication List     TAKE these medications    albuterol 108 (90 Base) MCG/ACT inhaler Commonly known as: VENTOLIN HFA Inhale 1-2 puffs into the lungs every 6 (six) hours as needed for wheezing or shortness of breath.   fluticasone 50 MCG/ACT nasal spray Commonly known as: FLONASE Place 1 spray into both nostrils daily.         Follow-up Information     Carla Vincent, Ihor Austin, MD. Schedule an appointment as soon as possible for a visit in 2 week(s).   Specialty: Obstetrics and Gynecology Why: for post-op check Contact information: [address] [place] [address] [phone]         Carla Vincent, Ihor Austin, MD. Schedule an appointment as soon as possible for a visit in 6 week(s).   Specialty: Obstetrics and Gynecology Contact information: [address] [place] [address] [phone]                 Signed: Cyril Vincent 09/25/2022 8:57 AM

## 2022-09-24 NOTE — Lactation Note (Signed)
This note was copied from a baby's chart. Lactation Consultation Note  Patient Name: Carla Vincent Date: 09/24/2022 Reason for consult: Initial assessment;Term Age:33 hours  Maternal Data This is mom's 2nd baby. Mom is an experienced breastfeeding mother. Her plan is to breastfeed this baby. Mom had tubal ligation today.  Today mom reports baby has been latching and breastfeeding well.  Has patient been taught Hand Expression?: Yes Does the patient have breastfeeding experience prior to this delivery?: Yes How long did the patient breastfeed?: 2 years  Feeding Mother's Current Feeding Choice: Breast Milk  Interventions Interventions: Breast feeding basics reviewed;Education Reviewed what to expect in early days including how to know the baby is getting enough, cluster feeding, and how to wake a sleepy baby.  Discharge Pump: Personal (Mom has a motif pump.)  Consult Status Consult Status: Follow-up Date: 09/25/22 Follow-up type: In-patient  Update provided to care nurse.  Fuller Song 09/24/2022, 4:52 PM

## 2022-09-24 NOTE — Progress Notes (Signed)
110 hour PP by me . Scheduled PP BTL . HCT good . Consent signed . Proceed

## 2022-09-24 NOTE — Anesthesia Procedure Notes (Signed)
Procedure Name: Intubation Date/Time: 09/24/2022 1:19 PM  Performed by: Staci Carver, CRNAPre-anesthesia Checklist: Patient identified, Patient being monitored, Timeout performed, Emergency Drugs available and Suction available Patient Re-evaluated:Patient Re-evaluated prior to induction Oxygen Delivery Method: Circle system utilized Preoxygenation: Pre-oxygenation with 100% oxygen Induction Type: IV induction Ventilation: Mask ventilation without difficulty Laryngoscope Size: Miller and 2 Grade View: Grade I Tube type: Oral Tube size: 6.5 mm Number of attempts: 1 Airway Equipment and Method: Stylet Placement Confirmation: ETT inserted through vocal cords under direct vision, positive ETCO2 and breath sounds checked- equal and bilateral Secured at: 20 cm Tube secured with: Tape Dental Injury: Teeth and Oropharynx as per pre-operative assessment

## 2022-09-25 LAB — RPR: RPR Ser Ql: NONREACTIVE

## 2022-09-25 NOTE — Progress Notes (Signed)
Patient discharged. Discharge instructions given. Patient verbalizes understanding. Transported by staff. 

## 2022-09-25 NOTE — Lactation Note (Signed)
This note was copied from a baby's chart. Lactation Consultation Note  Patient Name: Carla Vincent Date: 09/25/2022 Reason for consult: Follow-up assessment;Term Age:33 hours  Maternal Data Has patient been taught Hand Expression?: Yes Does the patient have breastfeeding experience prior to this delivery?: Yes How long did the patient breastfeed?: 39yrs  Feeding Mother's Current Feeding Choice: Breast Milk  Baby continuing to feed well, no pain/discomfort associated with feedings at this time. Baby is voiding and stooling above minimum expectations and has passed all 24hr screens.  LATCH Score    Lactation Tools Discussed/Used    Interventions Interventions: Breast feeding basics reviewed;Hand express;Pre-pump if needed;Reverse pressure;Hand pump;DEBP;Ice;Education  Reviewed need for 8 or more feedings per 24 hours, reviewed signs of transfer and adequate intake, tips for keeping baby awake/alert during feedings and tracking output. Reviewed cluster feeding/growth spurts.  Discharge Discharge Education: Engorgement and breast care;Warning signs for feeding baby;Outpatient recommendation Pump: Personal  Anticipatory guidance given for the management of breast fullness and engorgement. Encouraged the use of ice for swelling and anti-inflammatory's recommended by doctor if needed.   Consult Status Consult Status: Complete Date: 09/25/22  Outpatient lactation number provided; encouraged to call with questions and for ongoing BF support as needed.  Danford Bad 09/25/2022, 9:47 AM

## 2022-09-27 NOTE — Anesthesia Postprocedure Evaluation (Signed)
Anesthesia Post Note  Patient: Carla Vincent  Procedure(s) Performed: POST PARTUM TUBAL LIGATION (Bilateral)  Patient location during evaluation: PACU Anesthesia Type: General Level of consciousness: awake and alert Pain management: pain level controlled Vital Signs Assessment: post-procedure vital signs reviewed and stable Respiratory status: spontaneous breathing, nonlabored ventilation, respiratory function stable and patient connected to nasal cannula oxygen Cardiovascular status: blood pressure returned to baseline and stable Postop Assessment: no apparent nausea or vomiting Anesthetic complications: no  No notable events documented.   Last Vitals:  Vitals:   09/24/22 2308 09/25/22 0824  BP: 134/75 125/75  Pulse: 75 94  Resp:  20  Temp: 37.1 C 36.7 C  SpO2: 99% 100%    Last Pain:  Vitals:   09/25/22 0900  TempSrc:   PainSc: 0-No pain                 Stephanie Coup

## 2022-09-27 NOTE — H&P (Signed)
O;  VSS  Lungs CTA   CV rrr with murmur Abd: soft non tender , gravid

## 2022-09-28 ENCOUNTER — Encounter: Payer: Self-pay | Admitting: Obstetrics and Gynecology

## 2022-09-28 LAB — SURGICAL PATHOLOGY

## 2022-10-25 DIAGNOSIS — Z419 Encounter for procedure for purposes other than remedying health state, unspecified: Secondary | ICD-10-CM | POA: Diagnosis not present

## 2022-11-03 DIAGNOSIS — Z124 Encounter for screening for malignant neoplasm of cervix: Secondary | ICD-10-CM | POA: Diagnosis not present

## 2022-11-18 ENCOUNTER — Telehealth: Payer: Self-pay

## 2022-11-18 NOTE — Telephone Encounter (Signed)
[facility]- Discharge Call Backs-Pt did not answer.  Left her a voice mail about the following below. 1-Do you have any questions or concerns about yourself as you heal? 2-Any concerns or questions about your baby? 3-Where does your baby sleep in your home? Reviewed ABC's of safe sleep. 4-How was your stay at the hospital? 5-How did our team work together to care for you? You should be receiving a survey in the mail soon.   We would really appreciate it if you could fill that out for [place] and return it in the mail.  We value the feedback to make improvements and continue the great work we do.   If you have any questions please feel free to call me back at [phone]

## 2022-11-24 ENCOUNTER — Ambulatory Visit
Admission: EM | Admit: 2022-11-24 | Discharge: 2022-11-24 | Disposition: A | Discharge status: Home / Self Care | Payer: Medicaid Other | Attending: Family Medicine | Admitting: Family Medicine

## 2022-11-24 DIAGNOSIS — J020 Streptococcal pharyngitis: Secondary | ICD-10-CM | POA: Diagnosis not present

## 2022-11-24 LAB — GROUP A STREP BY PCR: Group A Strep by PCR: DETECTED — AB

## 2022-11-24 MED ORDER — AMOXICILLIN 500 MG PO CAPS: 1000.0000 mg | ORAL_CAPSULE | Freq: Every day | ORAL | 0 refills | Status: AC

## 2022-11-24 NOTE — ED Triage Notes (Signed)
Pt c/o sore throat and right ear pain x1day body aches and chills starting this morning.  Pt was around her daughter who has strep.

## 2022-11-24 NOTE — Discharge Instructions (Signed)
Stop by the pharmacy to pick up your prescriptions.  Follow up with your primary care provider as needed.  Gargle with warm salty water several times a day.  You can take Tylenol and/or Motrin as needed for discomfort.

## 2022-11-24 NOTE — ED Provider Notes (Signed)
[facility]    CSN: [id number] Arrival date & time: 11/24/22  0867      History   Chief Complaint Chief Complaint  Patient presents with   Sore Throat   Ear Pain    HPI [place] is a 32 y.o. female.   HPI   Mairen presents for sore throat, ear pain, chills that started yesterday. Took honey and lime for her symptoms as she was unsure what to take as she is breast feeding. Her daughter diagnosed with strep last week.  No known fever.       Past Medical History:  Diagnosis Date   Gallstones 03/2018   History of kidney stones    Left lower quadrant abdominal tenderness 03/15/2016   Nausea & vomiting 05/09/2016    Patient Active Problem List   Diagnosis Date Noted   NSVD (normal spontaneous vaginal delivery) 09/25/2022   Supervision of normal pregnancy 03/16/2022   Chronic cholecystitis    Labor and delivery, indication for care 06/28/2016   Nausea & vomiting 05/09/2016   Left lower quadrant abdominal tenderness 03/15/2016    Past Surgical History:  Procedure Laterality Date   ROBOTIC ASSISTED LAPAROSCOPIC CHOLECYSTECTOMY-MULTI SITE N/A 04/14/2018   Procedure: ROBOTIC ASSISTED LAPAROSCOPIC CHOLECYSTECTOMY-MULTI SITE;  Surgeon: Jules Husbands, MD;  Location: [facility] OR[facility];  Service: General;  Laterality: N/A;   TUBAL LIGATION Bilateral 09/24/2022   Procedure: POST PARTUM TUBAL LIGATION;  Surgeon: Boykin Nearing, MD;  Location: [facility];  Service: Gynecology;  Laterality: Bilateral;    OB History     Gravida  2   Para  2   Term  2   Preterm      AB      Living  2      SAB      IAB      Ectopic      Multiple  0   Live Births  2            Home Medications    Prior to Admission medications   Medication Sig Start Date End Date Taking? Authorizing Provider  amoxicillin (AMOXIL) 500 MG capsule Take 2 capsules (1,000 mg total) by mouth daily for 10 days. 11/24/22 12/04/22 Yes Geneviene Tesch, DO  albuterol (VENTOLIN HFA)  108 (90 Base) MCG/ACT inhaler Inhale 1-2 puffs into the lungs every 6 (six) hours as needed for wheezing or shortness of breath. Patient not taking: Reported on 09/23/2022 05/21/22   Luvenia Redden, PA-C  fluticasone [facility]) 50 MCG/ACT nasal spray Place 1 spray into both nostrils daily. Patient not taking: Reported on 09/23/2022 05/21/22   Luvenia Redden, PA-C    Family History Family History  Problem Relation Age of Onset   Uterine cancer Maternal Aunt    Breast cancer Paternal Aunt    Diabetes Paternal Grandfather     Social History Social History   Tobacco Use   Smoking status: Never   Smokeless tobacco: Never  Vaping Use   Vaping Use: Never used  Substance Use Topics   Alcohol use: Yes    Comment: occasionally   Drug use: Not Currently    Types: Marijuana     Allergies   Patient has no known allergies.   Review of Systems Review of Systems: negative unless otherwise stated in HPI.      Physical Exam Triage Vital Signs ED Triage Vitals  Enc Vitals Group     BP 11/24/22 0942 113/68     Pulse Rate 11/24/22  0942 99     Resp 11/24/22 0942 18     Temp 11/24/22 0942 99.7 F (37.6 C)     Temp Source 11/24/22 0942 Oral     SpO2 11/24/22 0942 95 %     Weight 11/24/22 0941 153 lb (69.4 kg)     Height 11/24/22 0941 5\' 2"  (1.575 m)     Head Circumference --      Peak Flow --      Pain Score 11/24/22 0940 10     Pain Loc --      Pain Edu? --      Excl. in [place]? --    No data found.  Updated Vital Signs BP 113/68 (BP Location: Left Arm)   Pulse 99   Temp 99.7 F (37.6 C) (Oral)   Resp 18   Ht 5\' 2"  (1.575 m)   Wt 69.4 kg   LMP 10/25/2022   SpO2 95%   Breastfeeding Yes   BMI 27.98 kg/m   Visual Acuity Right Eye Distance:   Left Eye Distance:   Bilateral Distance:    Right Eye Near:   Left Eye Near:    Bilateral Near:     Physical Exam GEN:     alert, non-toxic appearing female in no distress    HENT:  mucus membranes moist, oropharyngeal  without lesions or exudate, 3+ tonsillar hypertrophy, moderate oropharyngeal erythema, moderate erythematous edematous turbinates, no nasal discharge, bilateral TM erythema, no bulging or effusion EYES:   pupils equal and reactive, no scleral injection or discharge NECK:  normal ROM, bilateral anterior lymphadenopathy, no meningismus   RESP:  no increased work of breathing CVS:   regular rate  Skin:   warm and dry, no rash on visible skin    UC Treatments / Results  Labs (all labs ordered are listed, but only abnormal results are displayed) Labs Reviewed  GROUP A STREP BY PCR - Abnormal; Notable for the following components:      Result Value   Group A Strep by PCR DETECTED (*)    All other components within normal limits    EKG   Radiology No results found.  Procedures Procedures (including critical care time)  Medications Ordered in UC Medications - No data to display  Initial Impression / Assessment and Plan / UC Course  I have reviewed the triage vital signs and the nursing notes.  Pertinent labs & imaging results that were available during my care of the patient were reviewed by me and considered in my medical decision making (see chart for details).       Pt is a 32 y.o. female who presents for 1 day of respiratory symptoms. Ariely is afebrile here without recent antipyretics. Satting well on room air. Overall pt is non-toxic appearing, well hydrated, without respiratory distress. She has close strep exposure.  Strep PCR positive. Discussed symptomatic treatment. Treat with amoxicillin as below.  Typical duration of symptoms discussed. Work note provided  Return and ED precautions given and voiced understanding. Discussed MDM, treatment plan and plan for follow-up with patient who agrees with plan.     Final Clinical Impressions(s) / UC Diagnoses   Final diagnoses:  Strep pharyngitis     Discharge Instructions      Stop by the pharmacy to pick up your  prescriptions.  Follow up with your primary care provider as needed.  Gargle with warm salty water several times a day.  You can take Tylenol and/or Motrin as needed  for discomfort.       ED Prescriptions     Medication Sig Dispense Auth. Provider   amoxicillin (AMOXIL) 500 MG capsule Take 2 capsules (1,000 mg total) by mouth daily for 10 days. 20 capsule Lyndee Hensen, DO      PDMP not reviewed this encounter.   Lyndee Hensen, DO 11/24/22 1024

## 2022-11-25 DIAGNOSIS — Z419 Encounter for procedure for purposes other than remedying health state, unspecified: Secondary | ICD-10-CM | POA: Diagnosis not present

## 2022-11-28 NOTE — Anesthesia Postprocedure Evaluation (Signed)
Anesthesia Post Note  Patient: Carla Vincent  Procedure(s) Performed: AN AD HOC LABOR EPIDURAL  Patient location during evaluation: Mother Baby Anesthesia Type: Epidural Level of consciousness: awake and alert Pain management: pain level controlled Vital Signs Assessment: post-procedure vital signs reviewed and stable Respiratory status: spontaneous breathing, nonlabored ventilation and respiratory function stable Cardiovascular status: stable Postop Assessment: no headache, no backache and epidural receding Anesthetic complications: no   No notable events documented.   Last Vitals: There were no vitals filed for this visit.  Last Pain: There were no vitals filed for this visit.               Dimas Millin

## 2022-12-24 DIAGNOSIS — Z419 Encounter for procedure for purposes other than remedying health state, unspecified: Secondary | ICD-10-CM | POA: Diagnosis not present

## 2023-01-24 DIAGNOSIS — Z419 Encounter for procedure for purposes other than remedying health state, unspecified: Secondary | ICD-10-CM | POA: Diagnosis not present

## 2023-02-23 DIAGNOSIS — Z419 Encounter for procedure for purposes other than remedying health state, unspecified: Secondary | ICD-10-CM | POA: Diagnosis not present

## 2023-03-26 DIAGNOSIS — Z419 Encounter for procedure for purposes other than remedying health state, unspecified: Secondary | ICD-10-CM | POA: Diagnosis not present

## 2023-04-25 DIAGNOSIS — Z419 Encounter for procedure for purposes other than remedying health state, unspecified: Secondary | ICD-10-CM | POA: Diagnosis not present

## 2023-05-26 DIAGNOSIS — Z419 Encounter for procedure for purposes other than remedying health state, unspecified: Secondary | ICD-10-CM | POA: Diagnosis not present

## 2023-06-26 DIAGNOSIS — Z419 Encounter for procedure for purposes other than remedying health state, unspecified: Secondary | ICD-10-CM | POA: Diagnosis not present

## 2023-07-26 DIAGNOSIS — Z419 Encounter for procedure for purposes other than remedying health state, unspecified: Secondary | ICD-10-CM | POA: Diagnosis not present

## 2023-08-26 DIAGNOSIS — Z419 Encounter for procedure for purposes other than remedying health state, unspecified: Secondary | ICD-10-CM | POA: Diagnosis not present

## 2023-09-25 DIAGNOSIS — Z419 Encounter for procedure for purposes other than remedying health state, unspecified: Secondary | ICD-10-CM | POA: Diagnosis not present

## 2023-10-26 DIAGNOSIS — Z419 Encounter for procedure for purposes other than remedying health state, unspecified: Secondary | ICD-10-CM | POA: Diagnosis not present

## 2023-11-26 DIAGNOSIS — Z419 Encounter for procedure for purposes other than remedying health state, unspecified: Secondary | ICD-10-CM | POA: Diagnosis not present

## 2023-12-24 DIAGNOSIS — Z419 Encounter for procedure for purposes other than remedying health state, unspecified: Secondary | ICD-10-CM | POA: Diagnosis not present

## 2024-02-04 DIAGNOSIS — Z419 Encounter for procedure for purposes other than remedying health state, unspecified: Secondary | ICD-10-CM | POA: Diagnosis not present

## 2024-03-05 DIAGNOSIS — Z419 Encounter for procedure for purposes other than remedying health state, unspecified: Secondary | ICD-10-CM | POA: Diagnosis not present

## 2024-03-12 ENCOUNTER — Ambulatory Visit: Admitting: Family Medicine

## 2024-03-14 ENCOUNTER — Ambulatory Visit (INDEPENDENT_AMBULATORY_CARE_PROVIDER_SITE_OTHER): Admitting: Family Medicine

## 2024-03-14 ENCOUNTER — Encounter: Payer: Self-pay | Admitting: Family Medicine

## 2024-03-14 ENCOUNTER — Telehealth: Payer: Self-pay

## 2024-03-14 VITALS — BP 110/74 | HR 82 | Temp 98.3°F | Resp 16 | Ht 62.0 in | Wt 176.2 lb

## 2024-03-14 DIAGNOSIS — G8929 Other chronic pain: Secondary | ICD-10-CM | POA: Diagnosis not present

## 2024-03-14 DIAGNOSIS — Z7689 Persons encountering health services in other specified circumstances: Secondary | ICD-10-CM | POA: Diagnosis not present

## 2024-03-14 DIAGNOSIS — E66811 Obesity, class 1: Secondary | ICD-10-CM | POA: Diagnosis not present

## 2024-03-14 DIAGNOSIS — E6609 Other obesity due to excess calories: Secondary | ICD-10-CM | POA: Diagnosis not present

## 2024-03-14 DIAGNOSIS — M545 Low back pain, unspecified: Secondary | ICD-10-CM

## 2024-03-14 DIAGNOSIS — M25561 Pain in right knee: Secondary | ICD-10-CM | POA: Diagnosis not present

## 2024-03-14 DIAGNOSIS — M25562 Pain in left knee: Secondary | ICD-10-CM | POA: Diagnosis not present

## 2024-03-14 DIAGNOSIS — Z6832 Body mass index (BMI) 32.0-32.9, adult: Secondary | ICD-10-CM

## 2024-03-14 MED ORDER — SEMAGLUTIDE-WEIGHT MANAGEMENT 0.25 MG/0.5ML ~~LOC~~ SOAJ: 0.2500 mg | SUBCUTANEOUS | 0 refills | Status: DC

## 2024-03-14 NOTE — Patient Instructions (Signed)
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
## 2024-03-14 NOTE — Telephone Encounter (Signed)
(  Key: [id number])  Rx #: [id number]  AVWUJW 0.25MG /0.5ML auto-injectors  Form [employer] [place]  Electronic Prior Authorization Request Form [phone] NCPDP)

## 2024-03-15 ENCOUNTER — Telehealth: Payer: Self-pay

## 2024-03-15 ENCOUNTER — Other Ambulatory Visit (HOSPITAL_COMMUNITY): Payer: Self-pay

## 2024-03-15 ENCOUNTER — Ambulatory Visit

## 2024-03-15 ENCOUNTER — Other Ambulatory Visit: Payer: Self-pay | Admitting: *Deleted

## 2024-03-15 DIAGNOSIS — Z1329 Encounter for screening for other suspected endocrine disorder: Secondary | ICD-10-CM

## 2024-03-15 DIAGNOSIS — L409 Psoriasis, unspecified: Secondary | ICD-10-CM

## 2024-03-15 DIAGNOSIS — Z136 Encounter for screening for cardiovascular disorders: Secondary | ICD-10-CM

## 2024-03-15 DIAGNOSIS — E66811 Obesity, class 1: Secondary | ICD-10-CM | POA: Diagnosis not present

## 2024-03-15 DIAGNOSIS — E6609 Other obesity due to excess calories: Secondary | ICD-10-CM | POA: Diagnosis not present

## 2024-03-15 DIAGNOSIS — Z6832 Body mass index (BMI) 32.0-32.9, adult: Secondary | ICD-10-CM | POA: Diagnosis not present

## 2024-03-15 MED ORDER — CLOBETASOL PROPIONATE 0.05 % EX CREA: 1.0000 | TOPICAL_CREAM | Freq: Two times a day (BID) | CUTANEOUS | 0 refills | Status: DC

## 2024-03-15 MED ORDER — CLOBETASOL PROPIONATE 0.05 % EX SOLN: CUTANEOUS | 0 refills | Status: DC

## 2024-03-15 NOTE — Telephone Encounter (Signed)
 Pharmacy Patient Advocate Encounter   Received notification from Patient Pharmacy that prior authorization for Wegovy 0.25 is required/requested.   Insurance verification completed.   The patient is insured through [facility] .   Per test claim:

## 2024-03-15 NOTE — Telephone Encounter (Signed)
 Please see fax from [facility] pharmacy scanned into media.

## 2024-03-15 NOTE — Telephone Encounter (Signed)
 Pt called and said that [facility] pharmacy is still needing more information.  Insurance is still denying because they need more information about application, how much and how many times a day to use medication.

## 2024-03-15 NOTE — Telephone Encounter (Signed)
 Copied from CRM [id number]. Topic: Clinical - Prescription Issue >> Mar 15, 2024 11:04 AM Sasha H wrote: Reason for CRM: Pt states the medication that was sent over to the pharmacy today is supposed to be liquid and not a cream.

## 2024-03-15 NOTE — Addendum Note (Signed)
 Addended by: Evans Him on: 03/15/2024 03:53 PM   Modules accepted: Orders

## 2024-03-15 NOTE — Telephone Encounter (Signed)
 Provider has been made aware

## 2024-03-15 NOTE — Telephone Encounter (Signed)
 Prescriber has been made aware.

## 2024-03-16 ENCOUNTER — Encounter: Payer: Self-pay | Admitting: Family Medicine

## 2024-03-16 ENCOUNTER — Ambulatory Visit: Payer: Self-pay | Admitting: Family Medicine

## 2024-03-16 DIAGNOSIS — E66811 Obesity, class 1: Secondary | ICD-10-CM | POA: Insufficient documentation

## 2024-03-16 DIAGNOSIS — G8929 Other chronic pain: Secondary | ICD-10-CM | POA: Insufficient documentation

## 2024-03-16 DIAGNOSIS — M25561 Pain in right knee: Secondary | ICD-10-CM | POA: Insufficient documentation

## 2024-03-16 LAB — HEMOGLOBIN A1C
Est. average glucose Bld gHb Est-mCnc: 123 mg/dL
Hgb A1c MFr Bld: 5.9 % — ABNORMAL HIGH (ref 4.8–5.6)

## 2024-03-16 LAB — CBC WITH DIFFERENTIAL/PLATELET
Basophils Absolute: 0.1 10*3/uL (ref 0.0–0.2)
Basos: 1 %
EOS (ABSOLUTE): 0.1 10*3/uL (ref 0.0–0.4)
Eos: 2 %
Hematocrit: 44.2 % (ref 34.0–46.6)
Hemoglobin: 14.4 g/dL (ref 11.1–15.9)
Immature Grans (Abs): 0 10*3/uL (ref 0.0–0.1)
Immature Granulocytes: 0 %
Lymphocytes Absolute: 3 10*3/uL (ref 0.7–3.1)
Lymphs: 39 %
MCH: 28.3 pg (ref 26.6–33.0)
MCHC: 32.6 g/dL (ref 31.5–35.7)
MCV: 87 fL (ref 79–97)
Monocytes Absolute: 0.4 10*3/uL (ref 0.1–0.9)
Monocytes: 5 %
Neutrophils Absolute: 4.1 10*3/uL (ref 1.4–7.0)
Neutrophils: 53 %
Platelets: 175 10*3/uL (ref 150–450)
RBC: 5.08 x10E6/uL (ref 3.77–5.28)
RDW: 12.6 % (ref 11.7–15.4)
WBC: 7.7 10*3/uL (ref 3.4–10.8)

## 2024-03-16 LAB — COMPREHENSIVE METABOLIC PANEL WITH GFR
ALT: 24 IU/L (ref 0–32)
AST: 13 IU/L (ref 0–40)
Albumin: 4.3 g/dL (ref 3.9–4.9)
Alkaline Phosphatase: 72 IU/L (ref 44–121)
BUN/Creatinine Ratio: 22 (ref 9–23)
BUN: 13 mg/dL (ref 6–20)
Bilirubin Total: <0.2 mg/dL (ref 0.0–1.2)
CO2: 22 mmol/L (ref 20–29)
Calcium: 9 mg/dL (ref 8.7–10.2)
Chloride: 102 mmol/L (ref 96–106)
Creatinine, Ser: 0.6 mg/dL (ref 0.57–1.00)
Globulin, Total: 2.4 g/dL (ref 1.5–4.5)
Glucose: 88 mg/dL (ref 70–99)
Potassium: 4.6 mmol/L (ref 3.5–5.2)
Sodium: 136 mmol/L (ref 134–144)
Total Protein: 6.7 g/dL (ref 6.0–8.5)
eGFR: 121 mL/min/{1.73_m2} (ref >59)

## 2024-03-16 LAB — TSH RFX ON ABNORMAL TO FREE T4: TSH: 0.609 u[IU]/mL (ref 0.450–4.500)

## 2024-03-27 ENCOUNTER — Telehealth: Payer: Self-pay

## 2024-03-27 DIAGNOSIS — G8929 Other chronic pain: Secondary | ICD-10-CM

## 2024-03-28 ENCOUNTER — Ambulatory Visit: Admitting: Orthopedic Surgery

## 2024-04-05 DIAGNOSIS — Z419 Encounter for procedure for purposes other than remedying health state, unspecified: Secondary | ICD-10-CM | POA: Diagnosis not present

## 2024-04-13 ENCOUNTER — Ambulatory Visit (INDEPENDENT_AMBULATORY_CARE_PROVIDER_SITE_OTHER): Admitting: Family Medicine

## 2024-04-13 VITALS — BP 128/77 | HR 85 | Temp 98.1°F | Ht 62.0 in | Wt 164.0 lb

## 2024-04-13 DIAGNOSIS — E6609 Other obesity due to excess calories: Secondary | ICD-10-CM

## 2024-04-13 DIAGNOSIS — Z6830 Body mass index (BMI) 30.0-30.9, adult: Secondary | ICD-10-CM

## 2024-04-13 DIAGNOSIS — E66811 Obesity, class 1: Secondary | ICD-10-CM

## 2024-04-13 MED ORDER — SEMAGLUTIDE-WEIGHT MANAGEMENT 0.25 MG/0.5ML ~~LOC~~ SOAJ: 0.2500 mg | SUBCUTANEOUS | 1 refills | Status: DC

## 2024-04-13 NOTE — Patient Instructions (Signed)

## 2024-04-13 NOTE — Progress Notes (Signed)
   Established Patient Office Visit  Subjective  Patient ID: Vincy Feliz, female    DOB: [birth year]  Age: 33 y.o. MRN: [id number]  Chief Complaint  Patient presents with   Weight Management Screening   Brinna Shiffer is a pleasant 33 year old female patient who presents today for follow-up regarding weight management.  Initial visit 5/21: original weight 176lbs (BMI 32.2)  Today her weight is 164 lbs (BMI 30.0)  She has lost 12lbs since starting medication  Currently taking Wegovy  0.25mg  weekly  No nausea, emesis x1 Diet: eating smaller portions, less sugary cravings, focusing on healthy food options  Exercise: has been doing 10-20 minutes of HIIT exercise videos online   ROS: see HPI     Objective:    BP 128/77   Pulse 85   Temp 98.1 F (36.7 C) (Oral)   Ht 5' 2 (1.575 m)   Wt 164 lb (74.4 kg)   LMP 03/18/2024 (Approximate)   SpO2 96%   BMI 30.00 kg/m  BP Readings from Last 3 Encounters:  04/13/24 128/77  03/14/24 110/74  11/24/22 113/68    Physical Exam Vitals reviewed.  Constitutional:      Appearance: Normal appearance.   Cardiovascular:     Rate and Rhythm: Normal rate and regular rhythm.     Pulses: Normal pulses.     Heart sounds: Normal heart sounds.  Pulmonary:     Effort: Pulmonary effort is normal.     Breath sounds: Normal breath sounds.   Neurological:     Mental Status: She is alert.   Psychiatric:        Mood and Affect: Mood normal.        Behavior: Behavior normal.      Assessment & Plan:   1. Class 1 obesity due to excess calories without serious comorbidity with body mass index (BMI) of 30.0 to 30.9 in adult (Primary) Patient is a pleasant 33 year old female patient who presents today for weight loss management. BMI today 30.00 and last OV (5/21) BMI was 32.2. She has lost about 12 lbs in the past month, on 0.25mg  Wegovy  weekly. Discussed benefits of healthy lifestyle modifications- including choosing healthy nutrition options,  avoiding high-carbohydrate and sugary drinks, and focusing on daily physical activity. Vital signs stable. Patient wants to stay on the 0.25mg  dose weekly. Discussed signs of when to increase dose. Refill sent to pharmacy. Will follow-up in 4 weeks and can increase at that time if she notices cravings increase and weight loss is not as significant. Patient verbalized understanding and all questions answered.  - Semaglutide -Weight Management 0.25 MG/0.5ML SOAJ; Inject 0.25 mg into the skin once a week.  Dispense: 2 mL; Refill: 1  Return in about 4 weeks (around 05/11/2024) for weight loss f/u .    Wilhelmena Hanson, FNP

## 2024-05-05 DIAGNOSIS — Z419 Encounter for procedure for purposes other than remedying health state, unspecified: Secondary | ICD-10-CM | POA: Diagnosis not present

## 2024-05-11 ENCOUNTER — Ambulatory Visit: Admitting: Family Medicine

## 2024-05-17 ENCOUNTER — Encounter: Payer: Self-pay | Admitting: Family Medicine

## 2024-05-18 ENCOUNTER — Other Ambulatory Visit: Payer: Self-pay | Admitting: Family Medicine

## 2024-05-18 DIAGNOSIS — E6609 Other obesity due to excess calories: Secondary | ICD-10-CM

## 2024-05-18 MED ORDER — SEMAGLUTIDE-WEIGHT MANAGEMENT 0.5 MG/0.5ML ~~LOC~~ SOAJ: 0.5000 mg | SUBCUTANEOUS | 0 refills | Status: AC

## 2024-06-05 DIAGNOSIS — Z419 Encounter for procedure for purposes other than remedying health state, unspecified: Secondary | ICD-10-CM | POA: Diagnosis not present

## 2024-06-29 ENCOUNTER — Encounter: Payer: Self-pay | Admitting: Family Medicine

## 2024-06-29 ENCOUNTER — Other Ambulatory Visit: Payer: Self-pay | Admitting: Family Medicine

## 2024-06-29 ENCOUNTER — Ambulatory Visit (INDEPENDENT_AMBULATORY_CARE_PROVIDER_SITE_OTHER): Admitting: Family Medicine

## 2024-06-29 VITALS — BP 124/71 | HR 73 | Ht 62.0 in | Wt 153.6 lb

## 2024-06-29 DIAGNOSIS — L409 Psoriasis, unspecified: Secondary | ICD-10-CM

## 2024-06-29 DIAGNOSIS — E66811 Obesity, class 1: Secondary | ICD-10-CM | POA: Diagnosis not present

## 2024-06-29 DIAGNOSIS — Z6832 Body mass index (BMI) 32.0-32.9, adult: Secondary | ICD-10-CM

## 2024-06-29 DIAGNOSIS — E6609 Other obesity due to excess calories: Secondary | ICD-10-CM

## 2024-06-29 MED ORDER — CLOBETASOL PROPIONATE 0.05 % EX CREA: 1.0000 | TOPICAL_CREAM | Freq: Two times a day (BID) | CUTANEOUS | 2 refills | Status: DC

## 2024-06-29 MED ORDER — SEMAGLUTIDE-WEIGHT MANAGEMENT 1 MG/0.5ML ~~LOC~~ SOAJ: 1.0000 mg | SUBCUTANEOUS | 2 refills | Status: AC

## 2024-06-29 MED ORDER — CLOBETASOL PROPIONATE 0.05 % EX SOLN: CUTANEOUS | 2 refills | Status: AC

## 2024-06-29 NOTE — Progress Notes (Signed)
 Established Patient Office Visit  Subjective  Patient ID: Carla Vincent, female    DOB: Feb 13, 1991  Age: 33 y.o. MRN: [id number]  Chief Complaint  Patient presents with   Weight Loss   Psoriasis   Discussed the use of AI scribe software for clinical note transcription with the patient, who gave verbal consent to proceed.  History of Present Illness   Carla Vincent is a 33 year old female who presents for a weight loss follow-up.  She has lost weight, currently weighing 153 pounds, down from 164 pounds at her previous visit. She feels 'stuck' and experiences increased 'food noise,' particularly during her menstrual period. She is on a 0.5 mg dose of Wegovy  weekly for weight management. She has started doing [employer], which are about five minutes each and include exercises like squats, lunges, and burpees. These workouts are described as high-intensity with repetitions of three for each exercise.     Regarding her psoriasis, the liquid solution is effective for her scalp but not for other areas, such as her leg. She describes a persistent red, itchy area on her leg that has been present for two to three weeks, which she suspects might be a bite. Her husband has a similar small area. She is running out of the solution and notes that it is her 'go to' for managing her scalp symptoms.  Wt Readings from Last 3 Encounters:  06/29/24 153 lb 9.6 oz (69.7 kg)  04/13/24 164 lb (74.4 kg)  03/14/24 176 lb 3.2 oz (79.9 kg)   ROS: see HPI     Objective:     BP 124/71   Pulse 73   Ht 5' 2 (1.575 m)   Wt 153 lb 9.6 oz (69.7 kg)   LMP 06/21/2024 (Approximate)   SpO2 96%   Breastfeeding Yes   BMI 28.09 kg/m  BP Readings from Last 3 Encounters:  06/29/24 124/71  04/13/24 128/77  03/14/24 110/74     Physical Exam Vitals reviewed.  Constitutional:      Appearance: Normal appearance.  Cardiovascular:     Rate and Rhythm: Normal rate and regular rhythm.     Pulses:  Normal pulses.     Heart sounds: Normal heart sounds.  Pulmonary:     Effort: Pulmonary effort is normal.     Breath sounds: Normal breath sounds.  Neurological:     Mental Status: She is alert.  Psychiatric:        Mood and Affect: Mood normal.        Behavior: Behavior normal.     Assessment & Plan:   1. Class 1 obesity due to excess calories without serious comorbidity with body mass index (BMI) of 32.0 to 32.9 in adult (Primary) Weight decreased to 153 pounds from 164 pounds. Current medication dose may be losing effectiveness due to increased cravings. Discussed continuing with lifestyle modifications to enhance weight loss- including healthy diet and daily exercise. Increase medication dose to 1 mg- rx sent to pharmacy on file.  - semaglutide -weight management (WEGOVY ) 1 MG/0.5ML SOAJ SQ injection; Inject 1 mg into the skin once a week.  Dispense: 2 mL; Refill: 2  2. Psoriasis Liquid solution effective for scalp but not leg psoriasis. New red, itchy area behind right armpit may be insect bite or rash. Prescribe clobetasol  cream for leg psoriasis and new itchy area. Refill liquid solution for scalp psoriasis.  - clobetasol  cream (TEMOVATE ) 0.05 %; Apply 1 Application topically 2 (two) times daily.  Dispense:  90 g; Refill: 2 - clobetasol  (TEMOVATE ) 0.05 % external solution; One application to scalp twice daily until skin is flat and smooth.  Dispense: 50 mL; Refill: 2   Return in about 4 weeks (around 07/27/2024) for weight loss f/u .    Evalene Arts, FNP

## 2024-07-06 DIAGNOSIS — Z419 Encounter for procedure for purposes other than remedying health state, unspecified: Secondary | ICD-10-CM | POA: Diagnosis not present

## 2024-07-23 NOTE — Telephone Encounter (Signed)
 Copied from CRM [id number]. Topic: Clinical - Medication Question >> Jul 23, 2024  2:06 PM Larissa S wrote: Reason for CRM: Patient calling to request an earlier refill for GOP1 medication. She states her insurance will no longer cover the medication as of Oct. 1st. She is wanting to know if PCP will call in an earlier prescription for her to pickup, so that she can continue taking the medication for another month. Patient also states that the prescription for clobetasol  cream (TEMOVATE ) 0.05 % was not approved by Medicaid. She states that they need the prescription to state the exact amount of grams used for every application of medicine. Patient is requesting a callback from her PCP.

## 2024-07-24 ENCOUNTER — Telehealth: Payer: Self-pay

## 2024-07-24 NOTE — Telephone Encounter (Signed)
 Pls address the previous message.

## 2024-07-24 NOTE — Telephone Encounter (Signed)
 Copied from CRM #[id number]. Topic: Clinical - Medication Question >> Jul 24, 2024  1:54 PM Cleave MATSU wrote: Reason for CRM: caller needs refill on GOP1 she stated that she havent heard anything back yet. Please follow up with pt in regards to this

## 2024-07-25 ENCOUNTER — Telehealth: Payer: Self-pay

## 2024-07-25 ENCOUNTER — Encounter: Payer: Self-pay | Admitting: Family Medicine

## 2024-07-25 ENCOUNTER — Other Ambulatory Visit: Payer: Self-pay | Admitting: Family Medicine

## 2024-07-25 MED ORDER — CLOBETASOL PROPIONATE 0.05 % EX CREA: TOPICAL_CREAM | CUTANEOUS | 1 refills | Status: AC

## 2024-07-25 MED ORDER — SEMAGLUTIDE-WEIGHT MANAGEMENT 1.7 MG/0.75ML ~~LOC~~ SOAJ: 1.7000 mg | SUBCUTANEOUS | 1 refills | Status: AC

## 2024-07-25 NOTE — Telephone Encounter (Signed)
(  Key: [id number])  Rx #: [id number]  Wegovy  1.7MG /0.75ML auto-injectors  Form [employer] [place]  Electronic Prior Authorization Request Form [phone] N[place])

## 2024-07-25 NOTE — Telephone Encounter (Signed)
 Message from Plan  Denied. WEGOVY  Soln Auto-inj is an excluded product/service.  The health plan does not cover use of GLP-1s for weight management per [place] Prior Approval Criteria for GLP1s Wegovy  and Zepbound.

## 2024-07-27 ENCOUNTER — Ambulatory Visit: Admitting: Family Medicine

## 2024-08-05 DIAGNOSIS — Z419 Encounter for procedure for purposes other than remedying health state, unspecified: Secondary | ICD-10-CM | POA: Diagnosis not present

## 2024-08-27 ENCOUNTER — Encounter: Payer: Self-pay | Admitting: Radiology

## 2024-09-05 DIAGNOSIS — Z419 Encounter for procedure for purposes other than remedying health state, unspecified: Secondary | ICD-10-CM | POA: Diagnosis not present

## 2024-10-05 DIAGNOSIS — Z419 Encounter for procedure for purposes other than remedying health state, unspecified: Secondary | ICD-10-CM | POA: Diagnosis not present

## 2024-11-26 ENCOUNTER — Encounter: Payer: Self-pay | Admitting: Family Medicine

## 2024-11-27 NOTE — Telephone Encounter (Signed)
 Please see mychart messages sent and advise.
